# Patient Record
Sex: Female | Born: 1975 | Race: White | Hispanic: No | Marital: Married | Smoking: Never smoker
Health system: Southern US, Community
[De-identification: ages and names within clinical notes are randomized; demographics above are authoritative.]

## PROBLEM LIST (undated history)

## (undated) DIAGNOSIS — Z808 Family history of malignant neoplasm of other organs or systems: Secondary | ICD-10-CM

## (undated) DIAGNOSIS — E782 Mixed hyperlipidemia: Secondary | ICD-10-CM

## (undated) DIAGNOSIS — M961 Postlaminectomy syndrome, not elsewhere classified: Secondary | ICD-10-CM

## (undated) DIAGNOSIS — M51369 Other intervertebral disc degeneration, lumbar region without mention of lumbar back pain or lower extremity pain: Secondary | ICD-10-CM

## (undated) DIAGNOSIS — N871 Moderate cervical dysplasia: Secondary | ICD-10-CM

## (undated) DIAGNOSIS — M545 Low back pain, unspecified: Secondary | ICD-10-CM

## (undated) DIAGNOSIS — Z9889 Other specified postprocedural states: Secondary | ICD-10-CM

## (undated) DIAGNOSIS — M5136 Other intervertebral disc degeneration, lumbar region: Secondary | ICD-10-CM

## (undated) DIAGNOSIS — Z803 Family history of malignant neoplasm of breast: Secondary | ICD-10-CM

## (undated) HISTORY — PX: AUGMENTATION MAMMAPLASTY: SUR837

## (undated) HISTORY — DX: Family history of malignant neoplasm of breast: Z80.3

## (undated) HISTORY — DX: Other specified postprocedural states: Z98.890

## (undated) HISTORY — DX: Family history of malignant neoplasm of other organs or systems: Z80.8

## (undated) HISTORY — DX: Other intervertebral disc degeneration, lumbar region without mention of lumbar back pain or lower extremity pain: M51.369

## (undated) HISTORY — PX: TONSILLECTOMY: SUR1361

## (undated) HISTORY — DX: Postlaminectomy syndrome, not elsewhere classified: M96.1

## (undated) HISTORY — DX: Moderate cervical dysplasia: N87.1

## (undated) HISTORY — PX: BACK SURGERY: SHX140

## (undated) HISTORY — DX: Other intervertebral disc degeneration, lumbar region: M51.36

## (undated) HISTORY — DX: Low back pain, unspecified: M54.50

## (undated) HISTORY — DX: Mixed hyperlipidemia: E78.2

---
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
## 2000-05-30 ENCOUNTER — Inpatient Hospital Stay (HOSPITAL_COMMUNITY): Admission: AD | Admit: 2000-05-30 | Discharge: 2000-05-30 | Payer: Self-pay | Admitting: Gynecology

## 2000-05-30 ENCOUNTER — Inpatient Hospital Stay (HOSPITAL_COMMUNITY): Admission: AD | Admit: 2000-05-30 | Discharge: 2000-06-01 | Payer: Self-pay | Admitting: *Deleted

## 2000-06-02 ENCOUNTER — Encounter: Admission: RE | Admit: 2000-06-02 | Discharge: 2000-07-02 | Payer: Self-pay | Admitting: *Deleted

## 2000-07-11 ENCOUNTER — Other Ambulatory Visit: Admission: RE | Admit: 2000-07-11 | Discharge: 2000-07-11 | Payer: Self-pay | Admitting: *Deleted

## 2001-03-26 ENCOUNTER — Other Ambulatory Visit: Admission: RE | Admit: 2001-03-26 | Discharge: 2001-03-26 | Payer: Self-pay | Admitting: *Deleted

## 2001-08-15 ENCOUNTER — Inpatient Hospital Stay (HOSPITAL_COMMUNITY): Admission: AD | Admit: 2001-08-15 | Discharge: 2001-08-18 | Payer: Self-pay | Admitting: Gynecology

## 2001-09-27 ENCOUNTER — Other Ambulatory Visit: Admission: RE | Admit: 2001-09-27 | Discharge: 2001-09-27 | Payer: Self-pay | Admitting: *Deleted

## 2002-11-07 ENCOUNTER — Other Ambulatory Visit: Admission: RE | Admit: 2002-11-07 | Discharge: 2002-11-07 | Payer: Self-pay | Admitting: Gynecology

## 2005-01-19 ENCOUNTER — Other Ambulatory Visit: Admission: RE | Admit: 2005-01-19 | Discharge: 2005-01-19 | Payer: Self-pay | Admitting: Gynecology

## 2005-08-18 ENCOUNTER — Other Ambulatory Visit: Admission: RE | Admit: 2005-08-18 | Discharge: 2005-08-18 | Payer: Self-pay | Admitting: Gynecology

## 2006-03-08 ENCOUNTER — Other Ambulatory Visit: Admission: RE | Admit: 2006-03-08 | Discharge: 2006-03-08 | Payer: Self-pay | Admitting: Gynecology

## 2006-04-04 ENCOUNTER — Encounter: Admission: RE | Admit: 2006-04-04 | Discharge: 2006-04-04 | Payer: Self-pay | Admitting: Gynecology

## 2007-01-11 ENCOUNTER — Encounter: Admission: RE | Admit: 2007-01-11 | Discharge: 2007-01-11 | Payer: Self-pay | Admitting: Gynecology

## 2007-01-17 ENCOUNTER — Encounter: Admission: RE | Admit: 2007-01-17 | Discharge: 2007-01-17 | Payer: Self-pay | Admitting: Gynecology

## 2007-04-17 ENCOUNTER — Encounter: Admission: RE | Admit: 2007-04-17 | Discharge: 2007-04-17 | Payer: Self-pay | Admitting: Gynecology

## 2007-04-24 ENCOUNTER — Encounter: Admission: RE | Admit: 2007-04-24 | Discharge: 2007-04-24 | Payer: Self-pay | Admitting: Gynecology

## 2007-04-24 ENCOUNTER — Encounter (INDEPENDENT_AMBULATORY_CARE_PROVIDER_SITE_OTHER): Payer: Self-pay | Admitting: Diagnostic Radiology

## 2008-05-08 ENCOUNTER — Encounter: Admission: RE | Admit: 2008-05-08 | Discharge: 2008-05-08 | Payer: Self-pay | Admitting: Obstetrics & Gynecology

## 2008-05-29 ENCOUNTER — Ambulatory Visit: Payer: Self-pay | Admitting: Gynecology

## 2008-05-29 ENCOUNTER — Encounter: Payer: Self-pay | Admitting: Gynecology

## 2008-09-07 ENCOUNTER — Ambulatory Visit: Payer: Self-pay | Admitting: Diagnostic Radiology

## 2008-09-07 ENCOUNTER — Emergency Department (HOSPITAL_BASED_OUTPATIENT_CLINIC_OR_DEPARTMENT_OTHER): Admission: EM | Admit: 2008-09-07 | Discharge: 2008-09-07 | Payer: Self-pay | Admitting: Emergency Medicine

## 2009-01-15 IMAGING — US US ABDOMEN COMPLETE
1 series · 14 of 25 positions shown · non-contrast
Comparison: none

CLINICAL DATA: Abdominal pain, nausea.
ABDOMEN ULTRASOUND:
TECHNIQUE: Complete abdominal ultrasound examination was performed including evaluation of the liver, gallbladder, bile ducts, pancreas, kidneys, spleen, IVC, and abdominal aorta.

[Series 1: us abdomen complete · 0.24mm/px · 14 of 79 slices shown]
[im 1/79]
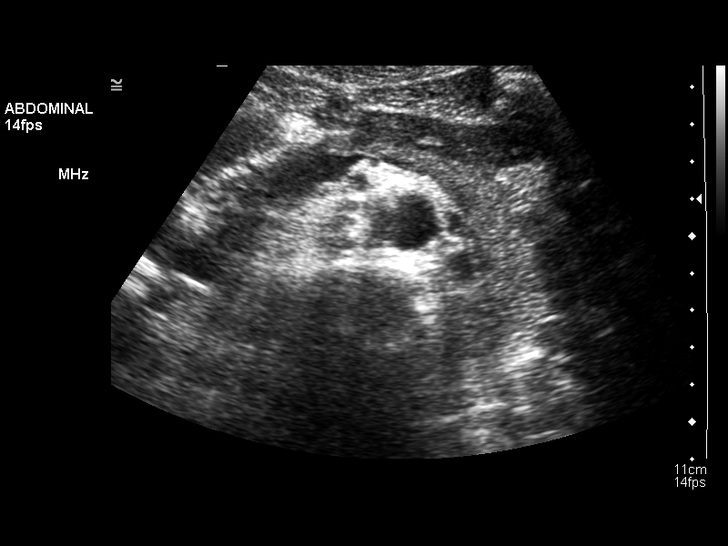
[im 7/79]
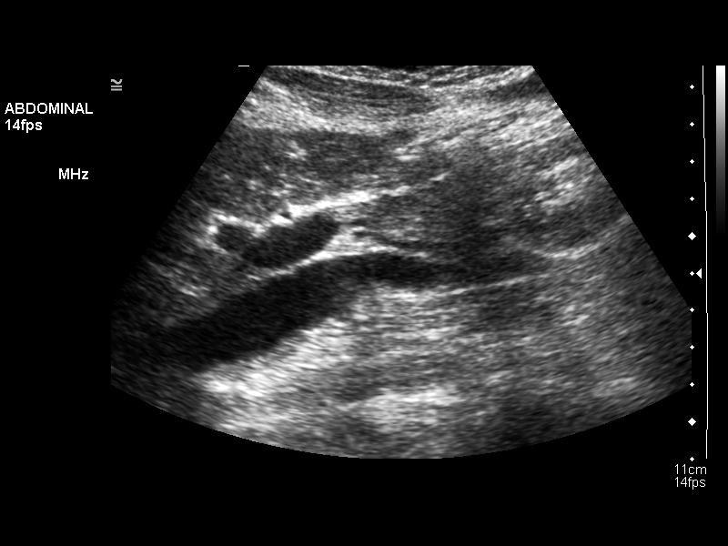
[im 14/79]
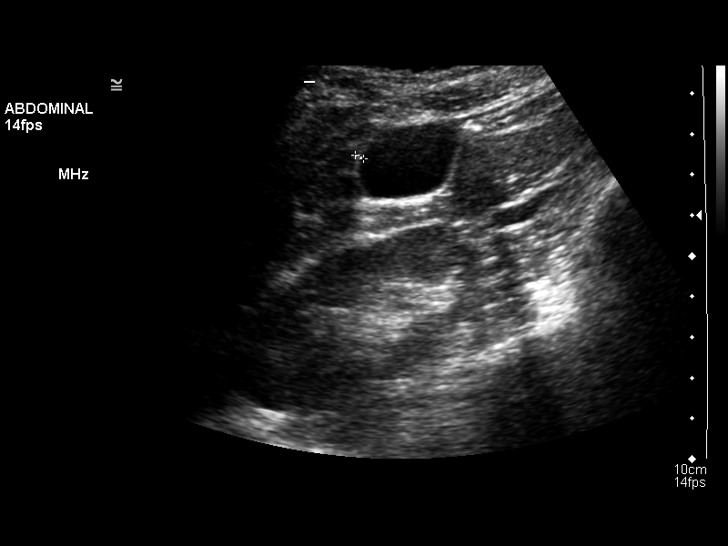
[im 20/79]
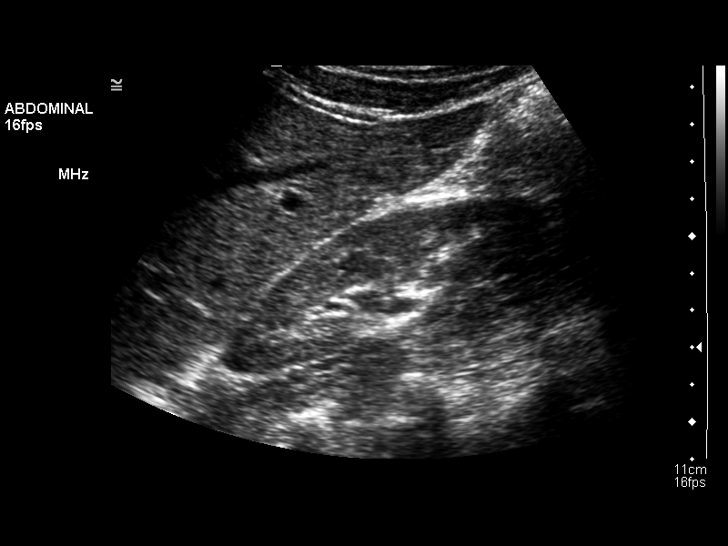
[im 27/79]
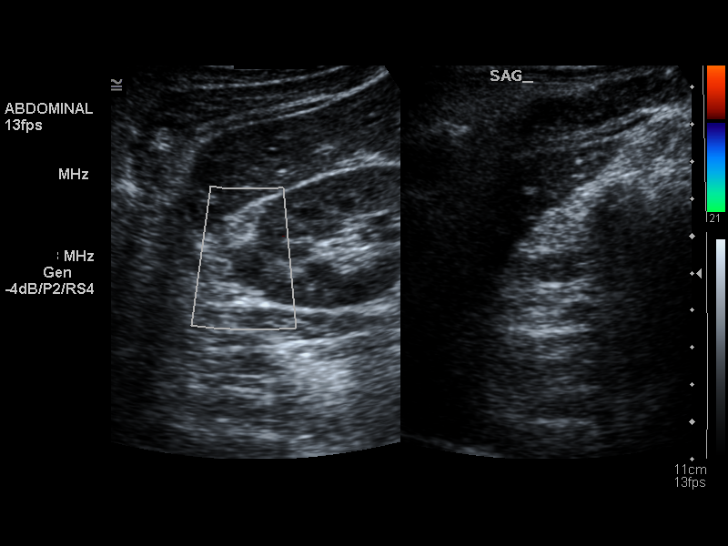
[im 30/79]
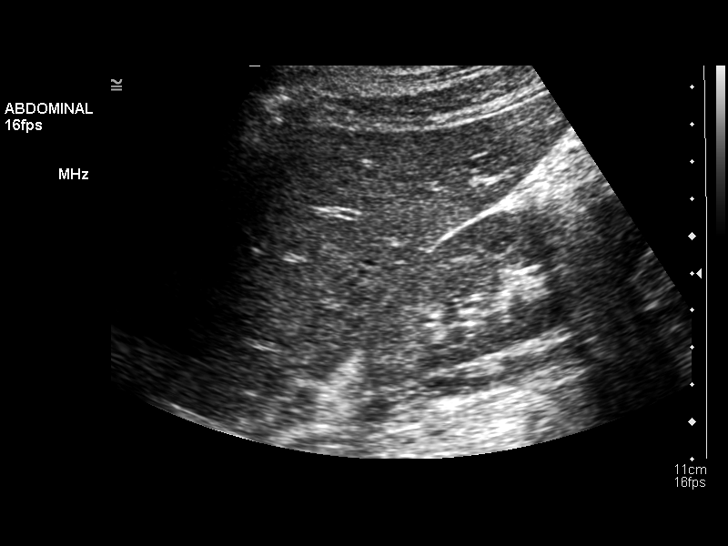
[im 36/79]
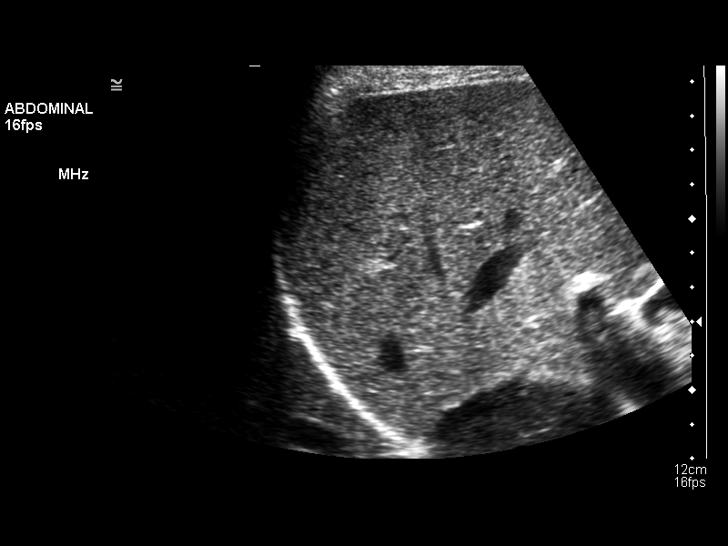
[im 43/79]
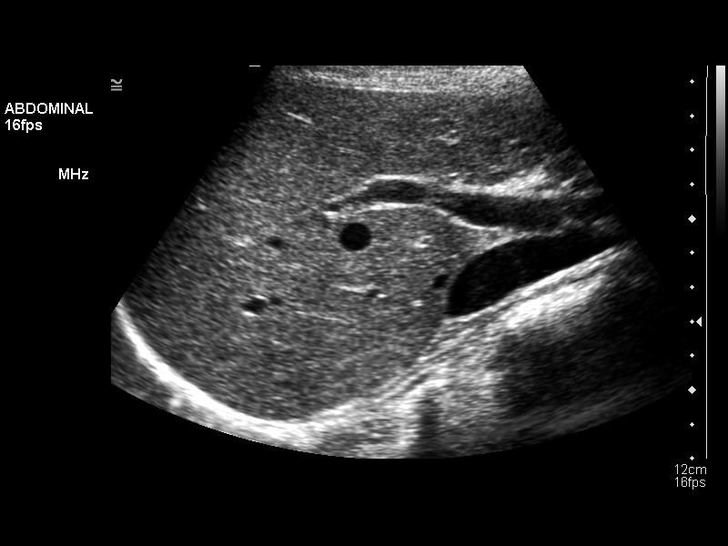
[im 49/79]
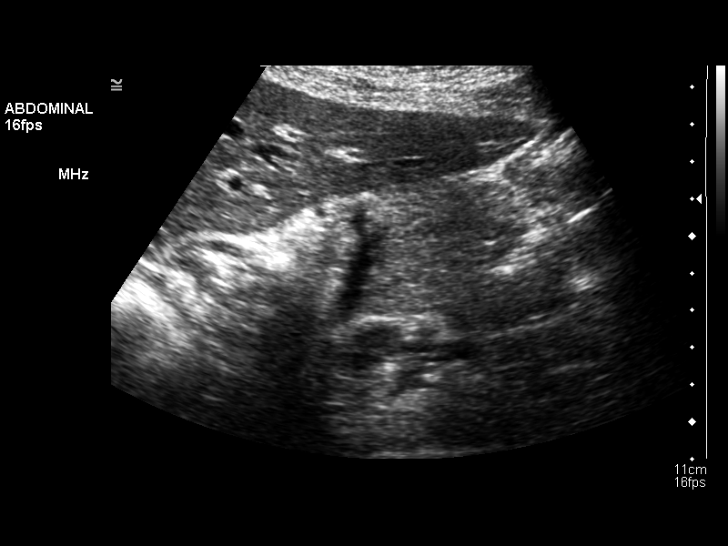
[im 53/79]
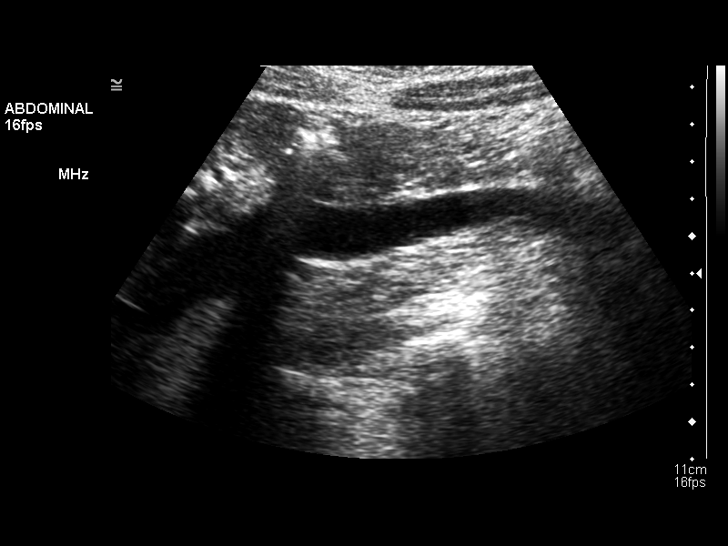
[im 59/79]
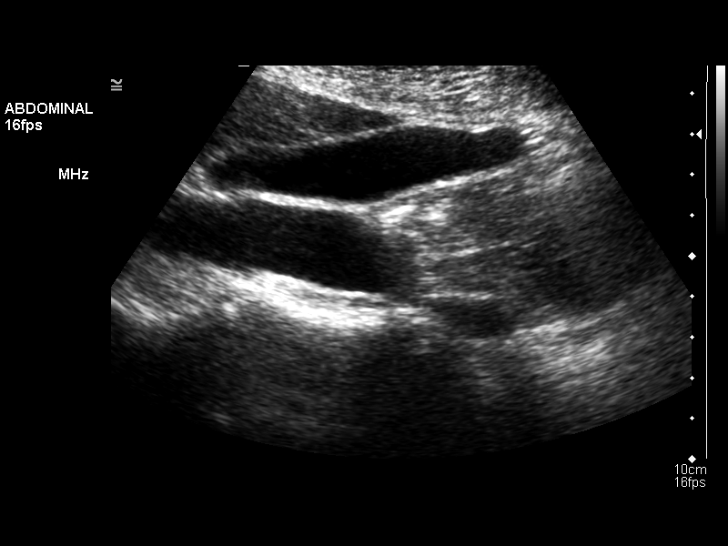
[im 66/79]
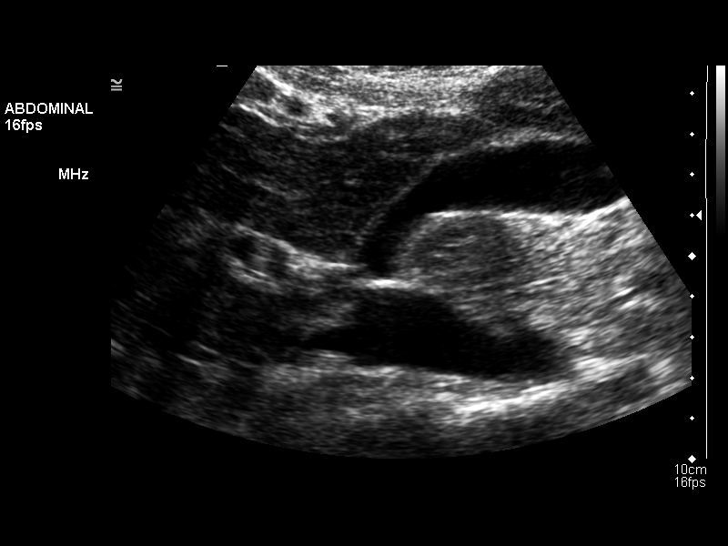
[im 72/79]
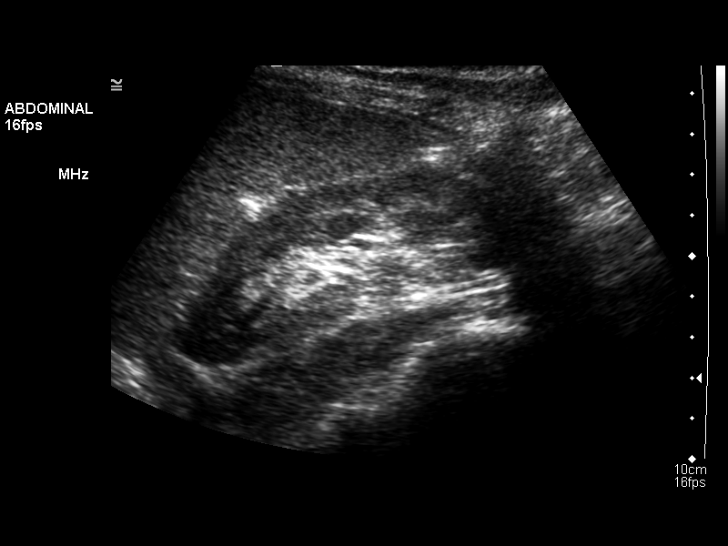
[im 79/79]
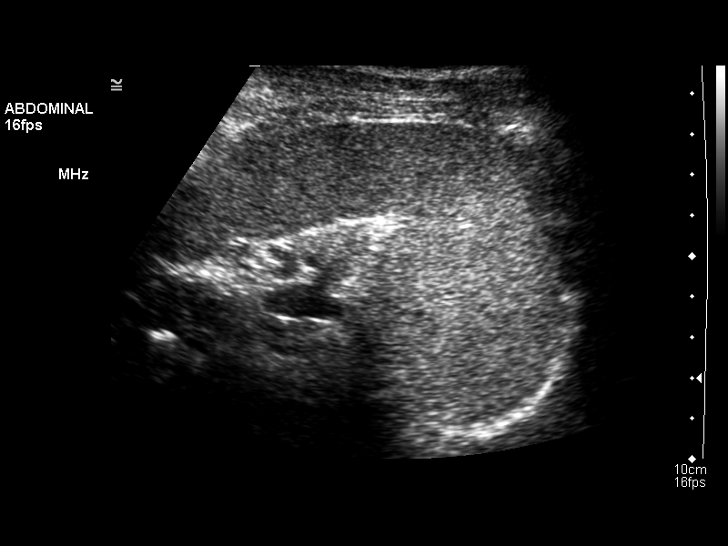

[14 of 25 positions shown; findings below may reference images not displayed]

FINDINGS: Normal gallbladder. Gallbladder wall thickness is 2.2 mm.  Common duct measures 3.0 mm in diameter, which is well within the normal limits.  No pancreatic or splenic abnormality.  Spleen measures 9.5 cm in length.  Right and left kidneys measure 10.4 cm and 11.3 cm in length, respectively.  
There is a 0.9 x 1.2 x 1.3 cm focal projection off the lateral aspect of the mid right kidney, which is suspicious for a small mass.  Early tumor cannot be excluded.  
Patent IVC.  Abdominal aorta maximum diameter is 2.0 cm.
IMPRESSION: Normal gallbladder.  No biliary ductal dilatation.  Small masslike projection off the lateral aspect of the right kidney, suspicious for a tumor or complicated cyst.Early tumor cannot be excluded at this point.  a CT scan with and without IV contrast for further imaging assessment or, if CT is not chosen, then I would at least obtain a follow-up ultrasound exam in 4 to 6 months.

## 2010-03-14 ENCOUNTER — Encounter: Payer: Self-pay | Admitting: Gynecology

## 2010-06-04 ENCOUNTER — Encounter (INDEPENDENT_AMBULATORY_CARE_PROVIDER_SITE_OTHER): Payer: BC Managed Care – PPO | Admitting: Women's Health

## 2010-06-04 ENCOUNTER — Other Ambulatory Visit: Payer: Self-pay | Admitting: Women's Health

## 2010-06-04 ENCOUNTER — Other Ambulatory Visit (HOSPITAL_COMMUNITY)
Admission: RE | Admit: 2010-06-04 | Discharge: 2010-06-04 | Disposition: A | Discharge status: Home / Self Care | Payer: BC Managed Care – PPO | Source: Ambulatory Visit | Attending: Gynecology | Admitting: Gynecology

## 2010-06-04 DIAGNOSIS — E079 Disorder of thyroid, unspecified: Secondary | ICD-10-CM

## 2010-06-04 DIAGNOSIS — Z13220 Encounter for screening for lipoid disorders: Secondary | ICD-10-CM

## 2010-06-04 DIAGNOSIS — Z01419 Encounter for gynecological examination (general) (routine) without abnormal findings: Secondary | ICD-10-CM

## 2010-06-04 DIAGNOSIS — Z833 Family history of diabetes mellitus: Secondary | ICD-10-CM

## 2010-06-04 DIAGNOSIS — Z124 Encounter for screening for malignant neoplasm of cervix: Secondary | ICD-10-CM | POA: Insufficient documentation

## 2010-07-09 NOTE — H&P (Signed)
[facility]  Patient:    [name], [name] Visit Number: [id number] MRN: [id number]          Service Type: OBS Location: 910B 9158 01 Attending Physician:  Tonye Royalty Dictated by:   Katy Fitch, M.D. Admit Date:  08/15/2001                           History and Physical  CHIEF COMPLAINT:              Term pregnancy.  HISTORY OF PRESENT ILLNESS:   The patient is a 35 year old G2, P1, at 39-4/7 weeks, brought in the hospital for induction secondary to a favorable cervix. The patient history had no prenatal complications and has had routine checkups.  PAST MEDICAL HISTORY:         None.  PAST SURGICAL HISTORY:        None.  MEDICATIONS:                  Prenatal vitamins.  ALLERGIES:                    None.  SOCIAL HISTORY:               Denied any tobacco, alcohol, or drugs.  FAMILY HISTORY:               Without any epithelial cancers or mental retardation.  PHYSICAL EXAMINATION:  VITAL SIGNS:                  Blood pressure 112/70.  HEENT:                        Throat clear.  CHEST:                        Lungs clear to auscultation bilaterally.  CARDIAC:                      Regular rate and rhythm.  ABDOMEN:                      Gravid, nontender.  Estimated fetal weight 7 pounds 8 ounces. PELVIC:                       Cervix 2, 80%, and -2.  ASSESSMENT AND PLAN:          A 35 year old G2, P2, at 39 weeks, favorable cervix.  Will induce with Pitocin.  The patient is GBS-negative, will not need antibiotic prophylaxis. Dictated by:   Katy Fitch, M.D. Attending Physician:  Tonye Royalty DD:  08/15/01 TD:  08/15/01 Job: [id number]

## 2010-07-09 NOTE — Discharge Summary (Signed)
[facility]  Patient:    [name], [name]                          MRN: [id number] Adm. Date:  [id number] Disc. Date: [id number] Attending:  Wetzel Bjornstad Dictator:   Antony Contras, N.P.                           Discharge Summary  DISCHARGE DIAGNOSES:          Intrauterine pregnancy at 38 weeks, spontaneous onset of labor.  PROCEDURE:                    Normal spontaneous vaginal delivery of viable infant over second degree midline episiotomy.  HISTORY OF PRESENT ILLNESS:   The patient is a 35 year old prima gravida with an LMP of September 02, 1999, [facility] June 08, 2000.  Prenatal course was complicated by history of migraine headaches and also a late entry into prenatal care.  PRENATAL LABORATORIES:        Blood type O+.  Antibody screen negative.  RPR, HBSAG, HIV nonreactive.  Rubella immune.  MSAFP normal.  HOSPITAL COURSE:              Patient presented on May 30, 2000 with regular contractions and dilatation of 4 cm, 90%, and 0 station.  She progressed to complete dilatation; delivered an Apgar 9/9 7 pound 11 ounce female infant over a second degree midline episiotomy.  Postpartum course:  She remained afebrile, had no difficulty voiding, was able to be discharged on her second postpartum day in satisfactory condition.  LABORATORIES:                 CBC:  Hematocrit 31.8, hemoglobin 11.5, WBC 14.2, platelets 209,000.  DISPOSITION:                  Follow-up six weeks.  Continue prenatal vitamins and iron.  Motrin and Tylox for pain. DD:  06/26/00 TD:  06/26/00 Job: [id number]

## 2010-07-09 NOTE — Discharge Summary (Signed)
[facility]  Patient:    [name], [name] Visit Number: [id number] MRN: [id number]          Service Type: OBS Location: 910A 9102 01 Attending Physician:  Tonye Royalty Dictated by:   Antony Contras, [facility] Admit Date:  08/15/2001 Discharge Date: 08/18/2001                             Discharge Summary  DISCHARGE DIAGNOSIS:          Intrauterine pregnancy at 39 weeks.  PROCEDURES:                   Normal spontaneous vaginal delivery of a viable infant over a second degree midline episiotomy.  HISTORY OF PRESENT ILLNESS:   The patient is a 35 year old, gravida 2, para 1-0-0-1, LMP November 13, 2000, [facility] August 20, 2001.  The prenatal course was uncomplicated.  LABORATORY DATA:              Blood type O+.  Antibody screen negative. Sickle cell negative.  RPR, HBSAG, and HIV nonreactive.  CBC with hematocrit 36.8, hemoglobin 12.4, WBC 11.8, and platelets 252.  HOSPITAL COURSE:              The patient was admitted on August 16, 2001, with spontaneous onset of labor.  Delivery was accomplished spontaneously. McRoberts procedure was attempted to accomplish delivery secondary to moderate shoulder dystocia.  The patient delivered an Apgar 5 and 7 female infant weighing 8 pounds 15 ounces.  During the postpartum course, the patient remained afebrile with no difficulty voiding.  DISPOSITION:                  She was able to be discharged on the second postpartum day in satisfactory condition.  FOLLOW-UP:                    Follow up in six weeks.  DISCHARGE MEDICATIONS:        Continue prenatal vitamins and iron.  Motrin or Tylox for pain. Dictated by:   Antony Contras, [facility] Attending Physician:  Tonye Royalty DD:  09/06/01 TD:  09/13/01 Job: [id number]

## 2011-12-06 ENCOUNTER — Other Ambulatory Visit: Payer: Self-pay | Admitting: Gynecology

## 2011-12-06 DIAGNOSIS — Z1231 Encounter for screening mammogram for malignant neoplasm of breast: Secondary | ICD-10-CM

## 2011-12-09 ENCOUNTER — Encounter: Payer: BC Managed Care – PPO | Admitting: Women's Health

## 2011-12-20 ENCOUNTER — Ambulatory Visit
Admission: RE | Admit: 2011-12-20 | Discharge: 2011-12-20 | Disposition: A | Discharge status: Home / Self Care | Payer: BC Managed Care – PPO | Source: Ambulatory Visit | Attending: Gynecology | Admitting: Gynecology

## 2011-12-20 DIAGNOSIS — Z1231 Encounter for screening mammogram for malignant neoplasm of breast: Secondary | ICD-10-CM

## 2013-05-10 ENCOUNTER — Ambulatory Visit: Payer: Self-pay

## 2013-05-10 ENCOUNTER — Other Ambulatory Visit: Payer: Self-pay | Admitting: Occupational Medicine

## 2013-05-10 DIAGNOSIS — M25562 Pain in left knee: Secondary | ICD-10-CM

## 2013-06-04 ENCOUNTER — Ambulatory Visit (INDEPENDENT_AMBULATORY_CARE_PROVIDER_SITE_OTHER): Payer: BC Managed Care – PPO | Admitting: Family Medicine

## 2013-06-04 VITALS — BP 130/84 | HR 88 | Temp 98.1°F | Resp 18 | Ht 68.0 in | Wt 167.8 lb

## 2013-06-04 DIAGNOSIS — M25569 Pain in unspecified knee: Secondary | ICD-10-CM

## 2013-06-04 DIAGNOSIS — M25562 Pain in left knee: Secondary | ICD-10-CM

## 2013-06-04 NOTE — Progress Notes (Signed)
I have discussed this case with Ms. Gessner, NP and agree.  

## 2013-06-04 NOTE — Progress Notes (Signed)
   Subjective:    Patient ID: [name], female    DOB: [birth year], 38 y.o.   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 She was seen [facility]. An Xray was obtained. Patient was denied WC and was put on light duty. She states she was denied WC because there was no accident. She presents today to have a note to return to full duty. She was advised to have an MRI and it was denied.  She reports that her knee pain has resolved completely.   Left knee xray 05/10/13: FINDINGS:  No acute bony abnormality. Specifically, no fracture, subluxation,  or dislocation. Soft tissues are intact. Joint spaces are  maintained. Normal bone mineralization. No joint effusion  IMPRESSION:  Negative.  PMH- none PSH- none  Patient sees Gyn regularly, no other PCP.   Review of Systems No knee pain, no swelling, no falls    Objective:   Physical Exam  Nursing note and vitals reviewed. Constitutional: She is oriented to person, place, and time. She appears well-developed and well-nourished.  HENT:  Head: Normocephalic and atraumatic.  Neck: Normal range of motion. Neck supple.  Pulmonary/Chest: Effort normal.  Musculoskeletal: Normal range of motion. She exhibits no edema and no tenderness.  Neurological: She is alert and oriented to person, place, and time.  Skin: Skin is warm and dry.  Psychiatric: She has a normal mood and affect. Her behavior is normal. Judgment and thought content normal.      Assessment & Plan:  1. Knee pain, left Resolved. Patient given note to return to full duties. Follow up if symptoms return.   Emi Belfasteborah B. Gessner, FNP-BC  [facility]  06/04/2013 9:50 AM

## 2013-06-10 ENCOUNTER — Other Ambulatory Visit: Payer: Self-pay | Admitting: Occupational Medicine

## 2013-06-10 ENCOUNTER — Ambulatory Visit: Payer: Self-pay

## 2013-06-10 DIAGNOSIS — M549 Dorsalgia, unspecified: Secondary | ICD-10-CM

## 2013-10-01 ENCOUNTER — Other Ambulatory Visit: Payer: Self-pay | Admitting: Women's Health

## 2013-10-01 ENCOUNTER — Telehealth: Payer: Self-pay | Admitting: *Deleted

## 2013-10-01 ENCOUNTER — Encounter: Payer: Self-pay | Admitting: Women's Health

## 2013-10-01 ENCOUNTER — Other Ambulatory Visit (HOSPITAL_COMMUNITY)
Admission: RE | Admit: 2013-10-01 | Discharge: 2013-10-01 | Disposition: A | Discharge status: Home / Self Care | Payer: BC Managed Care – PPO | Source: Ambulatory Visit | Attending: Women's Health | Admitting: Women's Health

## 2013-10-01 ENCOUNTER — Ambulatory Visit (INDEPENDENT_AMBULATORY_CARE_PROVIDER_SITE_OTHER): Payer: BC Managed Care – PPO | Admitting: Women's Health

## 2013-10-01 VITALS — BP 120/80 | Ht 67.0 in | Wt 183.6 lb

## 2013-10-01 DIAGNOSIS — Z1151 Encounter for screening for human papillomavirus (HPV): Secondary | ICD-10-CM | POA: Diagnosis present

## 2013-10-01 DIAGNOSIS — R87810 Cervical high risk human papillomavirus (HPV) DNA test positive: Secondary | ICD-10-CM | POA: Diagnosis present

## 2013-10-01 DIAGNOSIS — Z833 Family history of diabetes mellitus: Secondary | ICD-10-CM

## 2013-10-01 DIAGNOSIS — G4700 Insomnia, unspecified: Secondary | ICD-10-CM

## 2013-10-01 DIAGNOSIS — Z13220 Encounter for screening for lipoid disorders: Secondary | ICD-10-CM

## 2013-10-01 DIAGNOSIS — Z01419 Encounter for gynecological examination (general) (routine) without abnormal findings: Secondary | ICD-10-CM

## 2013-10-01 DIAGNOSIS — Z124 Encounter for screening for malignant neoplasm of cervix: Secondary | ICD-10-CM | POA: Diagnosis not present

## 2013-10-01 LAB — LIPID PANEL
CHOL/HDL RATIO: 2.7 ratio
Cholesterol: 210 mg/dL — ABNORMAL HIGH (ref 0–200)
HDL: 77 mg/dL (ref >39)
LDL Cholesterol: 94 mg/dL (ref 0–99)
TRIGLYCERIDES: 193 mg/dL — AB (ref <150)
VLDL: 39 mg/dL (ref 0–40)

## 2013-10-01 LAB — CBC WITH DIFFERENTIAL/PLATELET
BASOS PCT: 1 % (ref 0–1)
Basophils Absolute: 0 10*3/uL (ref 0.0–0.1)
Eosinophils Absolute: 0.1 10*3/uL (ref 0.0–0.7)
Eosinophils Relative: 3 % (ref 0–5)
HCT: 42 % (ref 36.0–46.0)
HEMOGLOBIN: 14.7 g/dL (ref 12.0–15.0)
LYMPHS ABS: 0.8 10*3/uL (ref 0.7–4.0)
Lymphocytes Relative: 20 % (ref 12–46)
MCH: 33.2 pg (ref 26.0–34.0)
MCHC: 35 g/dL (ref 30.0–36.0)
MCV: 94.8 fL (ref 78.0–100.0)
Monocytes Absolute: 0.5 10*3/uL (ref 0.1–1.0)
Monocytes Relative: 11 % (ref 3–12)
NEUTROS ABS: 2.7 10*3/uL (ref 1.7–7.7)
NEUTROS PCT: 65 % (ref 43–77)
PLATELETS: 248 10*3/uL (ref 150–400)
RBC: 4.43 MIL/uL (ref 3.87–5.11)
RDW: 12.5 % (ref 11.5–15.5)
WBC: 4.2 10*3/uL (ref 4.0–10.5)

## 2013-10-01 LAB — GLUCOSE, RANDOM: GLUCOSE: 101 mg/dL — AB (ref 70–99)

## 2013-10-01 MED ORDER — ALPRAZOLAM 0.25 MG PO TABS: 0.2500 mg | ORAL_TABLET | Freq: Every evening | ORAL | Status: DC | PRN

## 2013-10-01 NOTE — Telephone Encounter (Signed)
Telephone call, reviewed need to call counselor for situational stress/grief. Impending divorce. Xanax 0.25 at bedtime when necessary reviewed addictive, not to take daily, sleep hygiene reviewed.

## 2013-10-01 NOTE — Telephone Encounter (Signed)
Pt was seen today and was under impression Rx was going to sent to pharmacy for possible depression? Please advise

## 2013-10-01 NOTE — Patient Instructions (Signed)

## 2013-10-01 NOTE — Progress Notes (Signed)
[name] Jul 31, 1975 [id number]    History:    Presents for annual exam.  Regular monthly cycle/not sexually active. Separated from has been, no infidelity. Mother died from metastatic cancer 2012. Questions depression. Reports one abnormal Pap in the past but normal for the last 10 years. Last annual exam 2012.  Past medical history, past surgical history, family history and social history were all reviewed and documented in the EPIC chart. Works for [employer], [name] 11, /cidy 12 both doing okay with impending divorce.  ROS:  A  12 point ROS was performed and pertinent positives and negatives are included.  Exam:  Filed Vitals:   10/01/13 1212  BP: 120/80    General appearance:  Normal Thyroid:  Symmetrical, normal in size, without palpable masses or nodularity. Respiratory  Auscultation:  Clear without wheezing or rhonchi Cardiovascular  Auscultation:  Regular rate, without rubs, murmurs or gallops  Edema/varicosities:  Not grossly evident Abdominal  Soft,nontender, without masses, guarding or rebound.  Liver/spleen:  No organomegaly noted  Hernia:  None appreciated  Skin  Inspection:  Grossly normal   Breasts: Examined lying and sitting/bilateral saline implants.     Right: Without masses, retractions, discharge or axillary adenopathy.     Left: Without masses, retractions, discharge or axillary adenopathy. Gentitourinary   Inguinal/mons:  Normal without inguinal adenopathy  External genitalia:  Normal  BUS/Urethra/Skene's glands:  Normal  Vagina:  Normal  Cervix:  Normal  Uterus:  normal in size, shape and contour.  Midline and mobile  Adnexa/parametria:     Rt: Without masses or tenderness.   Lt: Without masses or tenderness.  Anus and perineum: Normal  Digital rectal exam: Normal sphincter tone without palpated masses or tenderness  Assessment/Plan:  38 y.o. Separated WF G2P2  for annual exam.    Situational grief Normal GYN exam  Plan: Reviewed/encouraged  counseling for impending divorce and loss of mother. (Tearful most of exam.)  SBE's, annual mammogram, instructed to schedule. Increase regular exercise, increase pleasurable activities. CBC, glucose, lipid panel, UA, Pap with HR HPV typing. New screening guidelines reviewed. Contraception reviewed and declined. States will return to office if needed.  Note: This dictation was prepared with Dragon/digital dictation.  Any transcriptional errors that result are unintentional. Harrington ChallengerYOUNG,Vianney Kopecky J WHNP, 1:25 PM 10/01/2013

## 2013-10-02 LAB — URINALYSIS W MICROSCOPIC + REFLEX CULTURE
BACTERIA UA: NONE SEEN
BILIRUBIN URINE: NEGATIVE
CASTS: NONE SEEN
CRYSTALS: NONE SEEN
Glucose, UA: NEGATIVE mg/dL
Hgb urine dipstick: NEGATIVE
Ketones, ur: NEGATIVE mg/dL
Leukocytes, UA: NEGATIVE
NITRITE: NEGATIVE
PROTEIN: NEGATIVE mg/dL
Specific Gravity, Urine: 1.019 (ref 1.005–1.030)
Squamous Epithelial / LPF: NONE SEEN
UROBILINOGEN UA: 0.2 mg/dL (ref 0.0–1.0)
pH: 7 (ref 5.0–8.0)

## 2013-10-03 LAB — CYTOLOGY - PAP

## 2013-10-22 ENCOUNTER — Ambulatory Visit (INDEPENDENT_AMBULATORY_CARE_PROVIDER_SITE_OTHER): Payer: BC Managed Care – PPO | Admitting: Gynecology

## 2013-10-22 ENCOUNTER — Encounter: Payer: Self-pay | Admitting: Gynecology

## 2013-10-22 VITALS — BP 126/70

## 2013-10-22 DIAGNOSIS — IMO0002 Reserved for concepts with insufficient information to code with codable children: Secondary | ICD-10-CM

## 2013-10-22 DIAGNOSIS — R6889 Other general symptoms and signs: Secondary | ICD-10-CM

## 2013-10-22 NOTE — Progress Notes (Signed)
   The patient is a 38 year old who presented to the office for colposcopy as a result of her recent Pap smear which demonstrated the following:  ATYPICAL SQUAMOUS CELLS OF UNDETERMINED SIGNIFICANCE (ASC-US).  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 Patient currently not sexually active. Patient reports normal menstrual cycles.  Patient underwent a detail colposcopic evaluation of the external genitalia, perineum, perirectal area as well as the vagina. After applying acetic gas into the vagina and cervix the following was noted:  Physical Exam  Genitourinary:     Assessment/plan: Recent past with atypical squamous cells of undetermined significance and high risk HPV identified but no hybrid capture. Acetowhite areas noted at the 12 and 6:00 position of the ectocervix were respectively biopsy and ECC. We will notify the patient with the results when available. Literature information was provided.

## 2013-10-22 NOTE — Patient Instructions (Signed)
Colposcopy Colposcopy is a procedure to examine your cervix and vagina, or the area around the outside of your vagina, for abnormalities or signs of disease. The procedure is done using a lighted microscope called a colposcope. Tissue samples may be collected during the colposcopy if your health care provider finds any unusual cells. A colposcopy may be done if a woman has:  An abnormal Pap test. A Pap test is a medical test done to evaluate cells that are on the surface of the cervix.  A Pap test result that is suggestive of human papillomavirus (HPV). This virus can cause genital warts and is linked to the development of cervical cancer.  A sore on her cervix and the results of a Pap test were normal.  Genital warts on the cervix or in or around the outside of the vagina.  A mother who took the drug diethylstilbestrol (DES) while pregnant.  Painful intercourse.  Vaginal bleeding, especially after sexual intercourse. LET YOUR HEALTH CARE PROVIDER KNOW ABOUT:  Any allergies you have.  All medicines you are taking, including vitamins, herbs, eye drops, creams, and over-the-counter medicines.  Previous problems you or members of your family have had with the use of anesthetics.  Any blood disorders you have.  Previous surgeries you have had.  Medical conditions you have. RISKS AND COMPLICATIONS Generally, a colposcopy is a safe procedure. However, as with any procedure, complications can occur. Possible complications include:  Bleeding.  Infection.  Missed lesions. BEFORE THE PROCEDURE   Tell your health care provider if you have your menstrual period. A colposcopy typically is not done during menstruation.  For 24 hours before the colposcopy, do not:  Douche.  Use tampons.  Use medicines, creams, or suppositories in the vagina.  Have sexual intercourse. PROCEDURE  During the procedure, you will be lying on your back with your feet in foot rests (stirrups). A warm  metal or plastic instrument (speculum) will be placed in your vagina to keep it open and to allow the health care provider to see the cervix. The colposcope will be placed outside the vagina. It will be used to magnify and examine the cervix, vagina, and the area around the outside of the vagina. A small amount of liquid solution will be placed on the area that is to be viewed. This solution will make it easier to see the abnormal cells. Your health care provider will use tools to suck out mucus and cells from the canal of the cervix. Then he or she will record the location of the abnormal areas. If a biopsy is done during the procedure, a medicine will usually be given to numb the area (local anesthetic). You may feel mild pain or cramping while the biopsy is done. After the procedure, tissue samples collected during the biopsy will be sent to a lab for analysis. AFTER THE PROCEDURE  You will be given instructions on when to follow up with your health care provider for your test results. It is important to keep your appointment. Document Released: 04/30/2002 Document Revised: 10/10/2012 Document Reviewed: 09/06/2012 ExitCare Patient Information 2015 [employer]. This information is not intended to replace advice given to you by your health care provider. Make sure you discuss any questions you have with your health care provider.  

## 2013-10-23 ENCOUNTER — Telehealth: Payer: Self-pay

## 2013-10-23 NOTE — Telephone Encounter (Signed)
Yes, please call and Valium 5 mg daily when necessary, #30 with 1 refill.  review with patient addictive,  use sparingly.

## 2013-10-23 NOTE — Telephone Encounter (Signed)
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 Glenetta Hew.  She said when she was in to see you you told her you would give her an Rx for Valium as she is going through a lot right now.  She said she checked at pharmacy and no Rx was there. Rx?

## 2013-10-24 MED ORDER — DIAZEPAM 5 MG PO TABS: 5.0000 mg | ORAL_TABLET | Freq: Four times a day (QID) | ORAL | Status: DC | PRN

## 2013-10-24 NOTE — Telephone Encounter (Signed)
Left detailed message on cell phone voice mail letting her know I called her Rx in and that [place] wanted me to warn her that this is very addictive and to use sparingly only when she feels like she really needs to use it.

## 2013-11-14 ENCOUNTER — Other Ambulatory Visit: Payer: Self-pay | Admitting: Gynecology

## 2013-11-14 ENCOUNTER — Ambulatory Visit (INDEPENDENT_AMBULATORY_CARE_PROVIDER_SITE_OTHER): Payer: BC Managed Care – PPO | Admitting: Gynecology

## 2013-11-14 ENCOUNTER — Encounter: Payer: Self-pay | Admitting: Gynecology

## 2013-11-14 DIAGNOSIS — N871 Moderate cervical dysplasia: Secondary | ICD-10-CM

## 2013-11-14 DIAGNOSIS — Z23 Encounter for immunization: Secondary | ICD-10-CM

## 2013-11-14 MED ORDER — CLINDAMYCIN PHOSPHATE 2 % VA CREA: 1.0000 | TOPICAL_CREAM | Freq: Every day | VAGINAL | Status: DC

## 2013-11-14 NOTE — Progress Notes (Signed)
   The patient presented to the office today for LEEP cervical conization. History as follows:  Pap smear August 15: ATYPICAL SQUAMOUS CELLS OF UNDETERMINED SIGNIFICANCE (ASC-US).  HPV High Risk  **DETECTED**  10/22/2013 patient went detail colposcopic evaluation with biopsy with the following pathology report:  Diagnosis 1. Endocervix, curettage - SCANT FRAGMENTS OF BENIGN ENDOCERVICAL MUCOSA, SEE COMMENT 2. Cervix, biopsy, 6 o'clock - LOW GRADE SQUAMOUS INTRAEPITHELIAL LESION, CIN-I (MILD DYSPLASIA), SEE COMMENT. 3. 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  A coated speculum was inserted into the vagina and colposcopic examination was performed with 4% acidic acid with findings noted above.  The cervix was then painted with Lugol's solution to delineate the margins of the lesion and transformation zone.  Approximately 5 cc's of  1 % xylocaine with epinephrine was infiltrated deep near the outer margin of the transformation zone circumferentially at 12, 3, 6, and 9 o'clock positions.  The [facility] Electrosurgical Generator was then turned on after the patient was grounded with pad electrode on her thigh and jewelry removed.  The settings on the generator were Blend 1 current 70 watts cut and 70 watts on the coagulation mode.  A size 20 x 12 loop electrode was utilized to exercise the atypical transformation zone.  The tip of the electrode was placed  3 mm from the edge of the lesion at 3, 6, 9 and 12 o'clock position.  The electrode was moved slowly over the lesion was within the loop limits.  A vaginal wall retractor was not used.  The loop was then repositioned and the finger switch on the hand held piece was activated ( or footpedal depressed).  A slight pressure on the shaft was applied and the loop was extended into the tissue up to its crossbar to a depth of 6 mm, then with steady, slow motion across and underneath the endocervical button was not excised.  The loop electrode was replaced with ball electrode set at 50 watts and the base of the crater was fulgurated circumferentially.  Monsell's paint was then applied for additional hemostasis.  A suture was placed at 12 o'clock position of cervical biopsy specimen for orientation, and placed in formation fixative for pathology evaluation.  Patient tolerated the procedure well with minimal blood loss and without any complications.  After the procedure patient left office with stable vital signs and instructions sheet.  Patient returned back in one month for followup. She will be prescribed Cleocin vaginal cream to apply each bedtime for 7-10 days. Patient received the flu vaccine today.   [facility] HMD12:19 PMTD@

## 2013-11-14 NOTE — Patient Instructions (Signed)
LEEP POST-PROCEDURE INSTRUCTIONS  1. You may take Ibuprofen, Aleve or Tylenol for pain if needed.  Cramping is normal.  2. You will have black and/or bloody discharge at first.  This will lighten and then turn clear before completely resolving.  This will take 2 to 3 weeks.  3. Put nothing in your vagina until the bleeding or discharge stops (usually 2 or3 days).  4. You need to call if you have redness around the biopsy site, if there is any unusual draining, if the bleeding is heavy, or if you are concerned.  5. Shower or bathe as normal  6. We will call you within one week with results or we will discuss the results at your follow-up appointment if needed.  7. You will need to return for a follow-up Pap smear as directed by your physician.  Loop Electrosurgical Excision Procedure Loop electrosurgical excision procedure (LEEP) is the removal of a portion of the lower part of the uterus (cervix). The procedure is done when there are significantly abnormal cervical cell changes. Abnormal cell changes of the cervix can lead to cancer if left in place and untreated.  The LEEP procedure itself typically only takes a few minutes. Often, it may be done in your caregiver's office. The procedure is considered safe for those who wish to get pregnant or are trying to get pregnant. Only under rare circumstances should this procedure be done if you are pregnant. LET YOUR CAREGIVER KNOW ABOUT:  Whether you are pregnant or late for your last menstrual period.  Allergies to foods or medicines.  All the medicines you are taking includingherbs, eyedrops, and over-the-counter medicines, and creams.  Use of steroids (by mouth or creams).  Previous problems with anesthetics or numbing medicine.  Previous gynecological surgery.  History of blood clots or bleeding problems.  Any recent or current vaginal infections (herpes, sexually transmitted infections).  Other health problems. RISKS AND  COMPLICATIONS  Bleeding.  Infection.  Injury to the vagina, bladder, or rectum.  Very rare obstruction of the cervical opening that causes problems during menstruation (cervical stenosis). BEFORE THE PROCEDURE  Do not take aspirin or blood thinners (anticoagulants) for 1 week before the procedure, or as told by your caregiver.  Eat a light meal before the procedure.  Ask your caregiver about changing or stopping your regular medicines.  You may be given a pain reliever 1 or 2 hours before the procedure. PROCEDURE   A tool (speculum) is placed in the vagina. This allows your caregiver to see the cervix.  An iodine stain is applied to the cervix to find the area of abnormal cells to be removed.  Medicine is injected to numb the cervix (local anesthetic).   Electricity is passed through a thin wire loop which is then used to remove (cauterize) a small segment of the affected cervix.  Light electrocautery is used to seal any small blood vessels and prevent bleeding.  A paste may be applied to the cauterized area of the cervix to help prevent bleeding.  The tissue sample is sent to the lab. It is examined under the microscope. AFTER THE PROCEDURE  Have someone drive you home.  You may have slight to moderate cramping.  You may notice a black vaginal discharge from the paste used on the cervix to prevent bleeding. This is normal.  Watch for excessive bleeding. This requires immediate medical care.  Ask when your test results will be ready. Make sure you get your test results. Document Released:  04/30/2002 Document Revised: 05/02/2011 Document Reviewed: 07/20/2010 ExitCare Patient Information 2015 [place], [place]. This information is not intended to replace advice given to you by your health care provider. Make sure you discuss any questions you have with your health care provider.  Influenza Virus Vaccine injection (Fluarix) What is this medicine? INFLUENZA VIRUS VACCINE  (in floo EN zuh VAHY ruhs vak SEEN) helps to reduce the risk of getting influenza also known as the flu. This medicine may be used for other purposes; ask your health care provider or pharmacist if you have questions. COMMON BRAND NAME(S): Fluarix, Fluzone What should I tell my health care provider before I take this medicine? They need to know if you have any of these conditions: -bleeding disorder like hemophilia -fever or infection -Guillain-Barre syndrome or other neurological problems -immune system problems -infection with the human immunodeficiency virus (HIV) or AIDS -low blood platelet counts -multiple sclerosis -an unusual or allergic reaction to influenza virus vaccine, eggs, chicken proteins, latex, gentamicin, other medicines, foods, dyes or preservatives -pregnant or trying to get pregnant -breast-feeding How should I use this medicine? This vaccine is for injection into a muscle. It is given by a health care professional. A copy of Vaccine Information Statements will be given before each vaccination. Read this sheet carefully each time. The sheet may change frequently. Talk to your pediatrician regarding the use of this medicine in children. Special care may be needed. Overdosage: If you think you have taken too much of this medicine contact a poison control center or emergency room at once. NOTE: This medicine is only for you. Do not share this medicine with others. What if I miss a dose? This does not apply. What may interact with this medicine? -chemotherapy or radiation therapy -medicines that lower your immune system like etanercept, anakinra, infliximab, and adalimumab -medicines that treat or prevent blood clots like warfarin -phenytoin -steroid medicines like prednisone or cortisone -theophylline -vaccines This list may not describe all possible interactions. Give your health care provider a list of all the medicines, herbs, non-prescription drugs, or dietary  supplements you use. Also tell them if you smoke, drink alcohol, or use illegal drugs. Some items may interact with your medicine. What should I watch for while using this medicine? Report any side effects that do not go away within 3 days to your doctor or health care professional. Call your health care provider if any unusual symptoms occur within 6 weeks of receiving this vaccine. You may still catch the flu, but the illness is not usually as bad. You cannot get the flu from the vaccine. The vaccine will not protect against colds or other illnesses that may cause fever. The vaccine is needed every year. What side effects may I notice from receiving this medicine? Side effects that you should report to your doctor or health care professional as soon as possible: -allergic reactions like skin rash, itching or hives, swelling of the face, lips, or tongue Side effects that usually do not require medical attention (report to your doctor or health care professional if they continue or are bothersome): -fever -headache -muscle aches and pains -pain, tenderness, redness, or swelling at site where injected -weak or tired This list may not describe all possible side effects. Call your doctor for medical advice about side effects. You may report side effects to FDA at 1-800-FDA-1088. Where should I keep my medicine? This vaccine is only given in a clinic, pharmacy, doctor's office, or other health care setting and will not  be stored at home. NOTE: This sheet is a summary. It may not cover all possible information. If you have questions about this medicine, talk to your doctor, pharmacist, or health care provider.  2015, Elsevier/Gold Standard. (2007-09-05 09:30:40)

## 2013-11-14 NOTE — Addendum Note (Signed)
Addended by: Berna Spare A on: 11/14/2013 12:27 PM   Modules accepted: Orders

## 2013-11-15 ENCOUNTER — Telehealth: Payer: Self-pay | Admitting: *Deleted

## 2013-11-15 NOTE — Telephone Encounter (Signed)
I would wait until her post op visit

## 2013-11-15 NOTE — Telephone Encounter (Signed)
(  JF patient) pt had LEEP done on 11/14/13 she asked when can she start having sex again? Pleas advise

## 2013-11-15 NOTE — Telephone Encounter (Signed)
Dr.Fernandez please see the below note. Do you recommend the same time frame 2 weeks?

## 2013-11-15 NOTE — Telephone Encounter (Signed)
Pt informed with the below note, pt asked to ask Dr.Fernandez the same question.

## 2013-11-15 NOTE — Telephone Encounter (Signed)
2 weeks.  

## 2013-11-18 NOTE — Telephone Encounter (Signed)
Left the below message on pt voicemail.

## 2013-11-19 ENCOUNTER — Telehealth: Payer: Self-pay

## 2013-11-19 ENCOUNTER — Other Ambulatory Visit: Payer: Self-pay | Admitting: Gynecology

## 2013-11-19 MED ORDER — CLINDAMYCIN PHOSPHATE 2 % VA CREA: 1.0000 | TOPICAL_CREAM | Freq: Every day | VAGINAL | Status: DC

## 2013-11-19 NOTE — Telephone Encounter (Signed)
Not unusual first 3-4 weeks after LEEP. Reassure. No intercourse until after office visit follow up. If bleeding like a menstrual cycle then i would need to see her before postop scheduled visit.

## 2013-11-19 NOTE — Telephone Encounter (Signed)
Patient had LEEP 11/14/13.  Reports some spotting onto pad and when she goes to bathroom toilet water pink and a little on tissue.

## 2013-11-19 NOTE — Telephone Encounter (Signed)
Patient informed. 

## 2013-11-19 NOTE — Telephone Encounter (Signed)
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 Will see her for previously scheduled post op visit. ------

## 2013-12-12 ENCOUNTER — Ambulatory Visit (INDEPENDENT_AMBULATORY_CARE_PROVIDER_SITE_OTHER): Payer: BC Managed Care – PPO | Admitting: Gynecology

## 2013-12-12 ENCOUNTER — Encounter: Payer: Self-pay | Admitting: Gynecology

## 2013-12-12 VITALS — BP 158/96

## 2013-12-12 DIAGNOSIS — B373 Candidiasis of vulva and vagina: Secondary | ICD-10-CM

## 2013-12-12 DIAGNOSIS — Z09 Encounter for follow-up examination after completed treatment for conditions other than malignant neoplasm: Secondary | ICD-10-CM

## 2013-12-12 DIAGNOSIS — B9689 Other specified bacterial agents as the cause of diseases classified elsewhere: Secondary | ICD-10-CM

## 2013-12-12 DIAGNOSIS — N760 Acute vaginitis: Secondary | ICD-10-CM

## 2013-12-12 DIAGNOSIS — A499 Bacterial infection, unspecified: Secondary | ICD-10-CM

## 2013-12-12 DIAGNOSIS — B3731 Acute candidiasis of vulva and vagina: Secondary | ICD-10-CM

## 2013-12-12 DIAGNOSIS — N898 Other specified noninflammatory disorders of vagina: Secondary | ICD-10-CM

## 2013-12-12 LAB — WET PREP FOR TRICH, YEAST, CLUE: TRICH WET PREP: NONE SEEN

## 2013-12-12 MED ORDER — FLUCONAZOLE 150 MG PO TABS: 150.0000 mg | ORAL_TABLET | Freq: Once | ORAL | Status: DC

## 2013-12-12 MED ORDER — TINIDAZOLE 500 MG PO TABS: ORAL_TABLET | ORAL | Status: DC

## 2013-12-12 NOTE — Progress Notes (Signed)
   Patient presented to the office today for 4 weeks postop visit after having had a LEEP cervical conization. Her history is as follows:  Pap smear August 15:  ATYPICAL SQUAMOUS CELLS OF UNDETERMINED SIGNIFICANCE (ASC-US).  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 Endocervix, curettage  - SCANT FRAGMENTS OF BENIGN ENDOCERVICAL MUCOSA, SEE COMMENT  2. Cervix, biopsy, 6 o'clock  - LOW GRADE SQUAMOUS INTRAEPITHELIAL LESION, CIN-I (MILD DYSPLASIA), SEE COMMENT.  3. Cervix, biopsy, 12 o'clock  - LOW AND HIGH GRADE SQUAMOUS INTRAEPITHELIAL LESION, CIN I AND II (MILD AND  MODERATE DYSPLASIA),  On 11/14/2013 patient with LEEP cervical conization and pathology report demonstrated the following:  Diagnosis Cervix, LEEP - HIGH GRADE SQUAMOUS INTRAEPITHELIAL LESION, CIN-II (MODERATE DYSPLASIA) AND ASSOCIATED LOW GRADE SQUAMOUS INTRAEPITHELIAL LESION, CIN-I (MILD DYSPLASIA). - RESECTION MARGINS ARE NEGATIVE FOR DYSPLASIA.  Exam: Bartholin urethra Skene was within normal limits Vagina: Clear discharge noted Cervix: Cervical bed almost completely healed. 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 Clinical evidence of bacterial vaginosis and yeast. Patient will be prescribed Diflucan 150 mg one by mouth today. She will also take Tindamax 500 mg tablets 4 today and 4 tomorrow. Patient may resume intercourse in 4 weeks. Annual exam with Pap smear in 2016.

## 2013-12-12 NOTE — Patient Instructions (Signed)
Monilial Vaginitis Vaginitis in a soreness, swelling and redness (inflammation) of the vagina and vulva. Monilial vaginitis is not a sexually transmitted infection. CAUSES  Yeast vaginitis is caused by yeast (candida) that is normally found in your vagina. With a yeast infection, the candida has overgrown in number to a point that upsets the chemical balance. SYMPTOMS   White, thick vaginal discharge.  Swelling, itching, redness and irritation of the vagina and possibly the lips of the vagina (vulva).  Burning or painful urination.  Painful intercourse. DIAGNOSIS  Things that may contribute to monilial vaginitis are:  Postmenopausal and virginal states.  Pregnancy.  Infections.  Being tired, sick or stressed, especially if you had monilial vaginitis in the past.  Diabetes. Good control will help lower the chance.  Birth control pills.  Tight fitting garments.  Using bubble bath, feminine sprays, douches or deodorant tampons.  Taking certain medications that kill germs (antibiotics).  Sporadic recurrence can occur if you become ill. TREATMENT  Your caregiver will give you medication.  There are several kinds of anti monilial vaginal creams and suppositories specific for monilial vaginitis. For recurrent yeast infections, use a suppository or cream in the vagina 2 times a week, or as directed.  Anti-monilial or steroid cream for the itching or irritation of the vulva may also be used. Get your caregiver's permission.  Painting the vagina with methylene blue solution may help if the monilial cream does not work.  Eating yogurt may help prevent monilial vaginitis. HOME CARE INSTRUCTIONS   Finish all medication as prescribed.  Do not have sex until treatment is completed or after your caregiver tells you it is okay.  Take warm sitz baths.  Do not douche.  Do not use tampons, especially scented ones.  Wear cotton underwear.  Avoid tight pants and panty  hose.  Tell your sexual partner that you have a yeast infection. They should go to their caregiver if they have symptoms such as mild rash or itching.  Your sexual partner should be treated as well if your infection is difficult to eliminate.  Practice safer sex. Use condoms.  Some vaginal medications cause latex condoms to fail. Vaginal medications that harm condoms are:  Cleocin cream.  Butoconazole (Femstat).  Terconazole (Terazol) vaginal suppository.  Miconazole (Monistat) (may be purchased over the counter). SEEK MEDICAL CARE IF:   You have a temperature by mouth above 102 F (38.9 C).  The infection is getting worse after 2 days of treatment.  The infection is not getting better after 3 days of treatment.  You develop blisters in or around your vagina.  You develop vaginal bleeding, and it is not your menstrual period.  You have pain when you urinate.  You develop intestinal problems.  You have pain with sexual intercourse. Document Released: 11/17/2004 Document Revised: 05/02/2011 Document Reviewed: 08/01/2008 ExitCare Patient Information 2015 [employer]. This information is not intended to replace advice given to you by your health care provider. Make sure you discuss any questions you have with your health care provider. Tinidazole tablets What is this medicine? TINIDAZOLE (tye NI da zole) is an antiinfective. It is used to treat amebiasis, giardiasis, trichomoniasis, and vaginosis. It will not work for colds, flu, or other viral infections. This medicine may be used for other purposes; ask your health care provider or pharmacist if you have questions. COMMON BRAND NAME(S): Tindamax What should I tell my health care provider before I take this medicine? They need to know if you have any   of these conditions: -anemia or other blood disorders -if you frequently drink alcohol containing drinks -receiving hemodialysis -seizure disorder -an unusual or  allergic reaction to tinidazole, other medicines, foods, dyes, or preservatives -pregnant or trying to get pregnant -breast-feeding How should I use this medicine? Take this medicine by mouth with a full glass of water. Follow the directions on the prescription label. Take with food. Take your medicine at regular intervals. Do not take your medicine more often than directed. Take all of your medicine as directed even if you think you are better. Do not skip doses or stop your medicine early. Talk to your pediatrician regarding the use of this medicine in children. While this drug may be prescribed for children as young as 3 years of age for selected conditions, precautions do apply. Overdosage: If you think you have taken too much of this medicine contact a poison control center or emergency room at once. NOTE: This medicine is only for you. Do not share this medicine with others. What if I miss a dose? If you miss a dose, take it as soon as you can. If it is almost time for your next dose, take only that dose. Do not take double or extra doses. What may interact with this medicine? Do not take this medicine with any of the following medications: -alcohol or any product that contains alcohol -amprenavir oral solution -disulfiram -paclitaxel injection -ritonavir oral solution -sertraline oral solution -sulfamethoxazole-trimethoprim injection This medicine may also interact with the following medications: -cholestyramine -cimetidine -conivaptan -cyclosporin -fluorouracil -fosphenytoin, phenytoin -ketoconazole -lithium -phenobarbital -tacrolimus -warfarin This list may not describe all possible interactions. Give your health care provider a list of all the medicines, herbs, non-prescription drugs, or dietary supplements you use. Also tell them if you smoke, drink alcohol, or use illegal drugs. Some items may interact with your medicine. What should I watch for while using this  medicine? Tell your doctor or health care professional if your symptoms do not improve or if they get worse. Avoid alcoholic drinks while you are taking this medicine and for three days afterward. Alcohol may make you feel dizzy, sick, or flushed. If you are being treated for a sexually transmitted disease, avoid sexual contact until you have finished your treatment. Your sexual partner may also need treatment. What side effects may I notice from receiving this medicine? Side effects that you should report to your doctor or health care professional as soon as possible: -allergic reactions like skin rash, itching or hives, swelling of the face, lips, or tongue -breathing problems -confusion, depression -dark or white patches in the mouth -feeling faint or lightheaded, falls -fever, infection -numbness, tingling, pain or weakness in the hands or feet -pain when passing urine -seizures -unusually weak or tired -vaginal irritation or discharge -vomiting Side effects that usually do not require medical attention (report to your doctor or health care professional if they continue or are bothersome): -dark brown or reddish urine -diarrhea -headache -loss of appetite -metallic taste -nausea -stomach upset This list may not describe all possible side effects. Call your doctor for medical advice about side effects. You may report side effects to FDA at 1-800-FDA-1088. Where should I keep my medicine? Keep out of the reach of children. Store at room temperature between 15 and 30 degrees C (59 and 86 degrees F). Protect from light and moisture. Keep container tightly closed. Throw away any unused medicine after the expiration date. NOTE: This sheet is a summary. It may not cover all   possible information. If you have questions about this medicine, talk to your doctor, pharmacist, or health care provider.  2015, Elsevier/Gold Standard. (2007-11-05 15:22:28) Bacterial Vaginosis Bacterial vaginosis  is a vaginal infection that occurs when the normal balance of bacteria in the vagina is disrupted. It results from an overgrowth of certain bacteria. This is the most common vaginal infection in women of childbearing age. Treatment is important to prevent complications, especially in pregnant women, as it can cause a premature delivery. CAUSES  Bacterial vaginosis is caused by an increase in harmful bacteria that are normally present in smaller amounts in the vagina. Several different kinds of bacteria can cause bacterial vaginosis. However, the reason that the condition develops is not fully understood. RISK FACTORS Certain activities or behaviors can put you at an increased risk of developing bacterial vaginosis, including:  Having a new sex partner or multiple sex partners.  Douching.  Using an intrauterine device (IUD) for contraception. Women do not get bacterial vaginosis from toilet seats, bedding, swimming pools, or contact with objects around them. SIGNS AND SYMPTOMS  Some women with bacterial vaginosis have no signs or symptoms. Common symptoms include:  Grey vaginal discharge.  A fishlike odor with discharge, especially after sexual intercourse.  Itching or burning of the vagina and vulva.  Burning or pain with urination. DIAGNOSIS  Your health care provider will take a medical history and examine the vagina for signs of bacterial vaginosis. A sample of vaginal fluid may be taken. Your health care provider will look at this sample under a microscope to check for bacteria and abnormal cells. A vaginal pH test may also be done.  TREATMENT  Bacterial vaginosis may be treated with antibiotic medicines. These may be given in the form of a pill or a vaginal cream. A second round of antibiotics may be prescribed if the condition comes back after treatment.  HOME CARE INSTRUCTIONS   Only take over-the-counter or prescription medicines as directed by your health care provider.  If  antibiotic medicine was prescribed, take it as directed. Make sure you finish it even if you start to feel better.  Do not have sex until treatment is completed.  Tell all sexual partners that you have a vaginal infection. They should see their health care provider and be treated if they have problems, such as a mild rash or itching.  Practice safe sex by using condoms and only having one sex partner. SEEK MEDICAL CARE IF:   Your symptoms are not improving after 3 days of treatment.  You have increased discharge or pain.  You have a fever. MAKE SURE YOU:   Understand these instructions.  Will watch your condition.  Will get help right away if you are not doing well or get worse. FOR MORE INFORMATION  Centers for Disease Control and Prevention, Division of STD Prevention: www.cdc.gov/std American Sexual Health Association (ASHA): www.ashastd.org  Document Released: 02/07/2005 Document Revised: 11/28/2012 Document Reviewed: 09/19/2012 ExitCare Patient Information 2015 [employer]. This information is not intended to replace advice given to you by your health care provider. Make sure you discuss any questions you have with your health care provider.  

## 2013-12-18 ENCOUNTER — Telehealth: Payer: Self-pay | Admitting: *Deleted

## 2013-12-18 NOTE — Telephone Encounter (Signed)
Pt informed with the below note. 

## 2013-12-18 NOTE — Telephone Encounter (Signed)
Pt has post op LEEP appointment on 12/12/13 pt said she spoke with you about some bleeding. Pt was prescribed Diflucan 150 mg & Tindamax 500 mg tablets 4 today and 4 tomorrow. Pt has not picked up Rx due to family health issues. States this am in shower 2 quarter size clots came out followed by bleeding. She is bleeding now, but period flow, not heavy. She asked if watch for now? Work in for OV? Rx for bleeding? Please advise

## 2013-12-18 NOTE — Telephone Encounter (Signed)
It sounds to me like this is the time for her menses. When I saw her recently she was 4 weeks out from her surgery and the cervix almost completely healed. We can watch for now. She did take ibuprofen every 6-8 hours which were cut down the bleeding and cramping from her cycle. But persist or any question she is welcome to come to the office and I will be more than glad to look at her

## 2014-03-07 ENCOUNTER — Other Ambulatory Visit: Payer: Self-pay

## 2014-03-07 DIAGNOSIS — Z803 Family history of malignant neoplasm of breast: Secondary | ICD-10-CM

## 2014-03-07 DIAGNOSIS — Z1231 Encounter for screening mammogram for malignant neoplasm of breast: Secondary | ICD-10-CM

## 2014-08-15 ENCOUNTER — Ambulatory Visit (INDEPENDENT_AMBULATORY_CARE_PROVIDER_SITE_OTHER): Payer: BLUE CROSS/BLUE SHIELD | Admitting: Women's Health

## 2014-08-15 ENCOUNTER — Ambulatory Visit: Payer: Self-pay | Admitting: Women's Health

## 2014-08-15 ENCOUNTER — Encounter: Payer: Self-pay | Admitting: Women's Health

## 2014-08-15 VITALS — BP 142/82 | Ht 67.0 in | Wt 195.0 lb

## 2014-08-15 DIAGNOSIS — Z113 Encounter for screening for infections with a predominantly sexual mode of transmission: Secondary | ICD-10-CM

## 2014-08-15 DIAGNOSIS — N871 Moderate cervical dysplasia: Secondary | ICD-10-CM

## 2014-08-15 LAB — HEPATITIS B SURFACE ANTIGEN: Hepatitis B Surface Ag: NEGATIVE

## 2014-08-15 LAB — HEPATITIS C ANTIBODY: HCV AB: NEGATIVE

## 2014-08-15 NOTE — Progress Notes (Signed)
Patient ID: [name], female   DOB: 24-Mar-1975, 39 y.o.   MRN: [id number] Presents to discuss birth control options and for STD screening. Not sexually active greater than one year. Starting a new relationship. Regular monthly cycle. Denies vaginal discharge or any problems at this time.  Exam: Appears well. External genitalia within normal limits, speculum exam scant discharge GC/Chlamydia culture taken. Bimanual no CMT or adnexal tenderness.  STD screen Contraception management  Plan: GC/Chlamydia, HIV, hep B, C, RPR. Options reviewed blood pressure slightly elevated, will check away from office if continues greater than 130/80 follow-up with primary care. Mirena IUD information given and reviewed slight risk for infection, perforation, hemorrhage. Dr. Lily Peer to place with cycle, will check coverage will schedule appointment with next cycle. Condoms encouraged until permanent partner.

## 2014-08-15 NOTE — Patient Instructions (Signed)
Levonorgestrel intrauterine device (IUD) What is this medicine? LEVONORGESTREL IUD (LEE voe nor jes trel) is a contraceptive (birth control) device. The device is placed inside the uterus by a healthcare professional. It is used to prevent pregnancy and can also be used to treat heavy bleeding that occurs during your period. Depending on the device, it can be used for 3 to 5 years. This medicine may be used for other purposes; ask your health care provider or pharmacist if you have questions. COMMON BRAND NAME(S): Verda Cumins What should I tell my health care provider before I take this medicine? They need to know if you have any of these conditions: -abnormal Pap smear -cancer of the breast, uterus, or cervix -diabetes -endometritis -genital or pelvic infection now or in the past -have more than one sexual partner or your partner has more than one partner -heart disease -history of an ectopic or tubal pregnancy -immune system problems -IUD in place -liver disease or tumor -problems with blood clots or take blood-thinners -use intravenous drugs -uterus of unusual shape -vaginal bleeding that has not been explained -an unusual or allergic reaction to levonorgestrel, other hormones, silicone, or polyethylene, medicines, foods, dyes, or preservatives -pregnant or trying to get pregnant -breast-feeding How should I use this medicine? This device is placed inside the uterus by a health care professional. Talk to your pediatrician regarding the use of this medicine in children. Special care may be needed. Overdosage: If you think you have taken too much of this medicine contact a poison control center or emergency room at once. NOTE: This medicine is only for you. Do not share this medicine with others. What if I miss a dose? This does not apply. What may interact with this medicine? Do not take this medicine with any of the following  medications: -amprenavir -bosentan -fosamprenavir This medicine may also interact with the following medications: -aprepitant -barbiturate medicines for inducing sleep or treating seizures -bexarotene -griseofulvin -medicines to treat seizures like carbamazepine, ethotoin, felbamate, oxcarbazepine, phenytoin, topiramate -modafinil -pioglitazone -rifabutin -rifampin -rifapentine -some medicines to treat HIV infection like atazanavir, indinavir, lopinavir, nelfinavir, tipranavir, ritonavir -St. John's wort -warfarin This list may not describe all possible interactions. Give your health care provider a list of all the medicines, herbs, non-prescription drugs, or dietary supplements you use. Also tell them if you smoke, drink alcohol, or use illegal drugs. Some items may interact with your medicine. What should I watch for while using this medicine? Visit your doctor or health care professional for regular check ups. See your doctor if you or your partner has sexual contact with others, becomes HIV positive, or gets a sexual transmitted disease. This product does not protect you against HIV infection (AIDS) or other sexually transmitted diseases. You can check the placement of the IUD yourself by reaching up to the top of your vagina with clean fingers to feel the threads. Do not pull on the threads. It is a good habit to check placement after each menstrual period. Call your doctor right away if you feel more of the IUD than just the threads or if you cannot feel the threads at all. The IUD may come out by itself. You may become pregnant if the device comes out. If you notice that the IUD has come out use a backup birth control method like condoms and call your health care provider. Using tampons will not change the position of the IUD and are okay to use during your period. What side effects may  I notice from receiving this medicine? Side effects that you should report to your doctor or  health care professional as soon as possible: -allergic reactions like skin rash, itching or hives, swelling of the face, lips, or tongue -fever, flu-like symptoms -genital sores -high blood pressure -no menstrual period for 6 weeks during use -pain, swelling, warmth in the leg -pelvic pain or tenderness -severe or sudden headache -signs of pregnancy -stomach cramping -sudden shortness of breath -trouble with balance, talking, or walking -unusual vaginal bleeding, discharge -yellowing of the eyes or skin Side effects that usually do not require medical attention (report to your doctor or health care professional if they continue or are bothersome): -acne -breast pain -change in sex drive or performance -changes in weight -cramping, dizziness, or faintness while the device is being inserted -headache -irregular menstrual bleeding within first 3 to 6 months of use -nausea This list may not describe all possible side effects. Call your doctor for medical advice about side effects. You may report side effects to FDA at 1-800-FDA-1088. Where should I keep my medicine? This does not apply. NOTE: This sheet is a summary. It may not cover all possible information. If you have questions about this medicine, talk to your doctor, pharmacist, or health care provider.  2015, [employer]vier/Gold Standard. (2011-03-10 13:54:04)  

## 2014-08-16 LAB — HIV ANTIBODY (ROUTINE TESTING W REFLEX): HIV 1&2 Ab, 4th Generation: NONREACTIVE

## 2014-08-16 LAB — GC/CHLAMYDIA PROBE AMP
CT Probe RNA: NEGATIVE
GC Probe RNA: NEGATIVE

## 2014-08-16 LAB — RPR

## 2014-08-19 ENCOUNTER — Ambulatory Visit: Payer: BLUE CROSS/BLUE SHIELD | Admitting: Gynecology

## 2014-08-22 ENCOUNTER — Telehealth: Payer: Self-pay | Admitting: *Deleted

## 2014-08-22 NOTE — Telephone Encounter (Signed)
Pt informed with negative STD results for OV 08/15/14

## 2014-10-07 ENCOUNTER — Encounter: Payer: Self-pay | Admitting: Gynecology

## 2014-10-07 ENCOUNTER — Ambulatory Visit (INDEPENDENT_AMBULATORY_CARE_PROVIDER_SITE_OTHER): Payer: BLUE CROSS/BLUE SHIELD | Admitting: Gynecology

## 2014-10-07 ENCOUNTER — Encounter: Payer: Self-pay | Admitting: Women's Health

## 2014-10-07 VITALS — BP 128/84

## 2014-10-07 DIAGNOSIS — Z975 Presence of (intrauterine) contraceptive device: Secondary | ICD-10-CM | POA: Insufficient documentation

## 2014-10-07 DIAGNOSIS — Z30430 Encounter for insertion of intrauterine contraceptive device: Secondary | ICD-10-CM

## 2014-10-07 NOTE — Progress Notes (Signed)
   Patient is a 39 year old who presented to the office today for placement of Mirena IUD. She had been using condoms for contraception which she's been sexually active. She does suffer from heavy cycles although regular. She recently had a full STD screen because she's had a new partner over the past year and it was negative. 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  Procedure note:                                                                    IUD procedure note       Patient presented to the office today for placement of Mirena IUD. The patient had previously been provided with literature information on this method of contraception. The risks benefits and pros and cons were discussed and all her questions were answered. She is fully aware that this form of contraception is 99% effective and is good for 5 years.  Pelvic exam: Bartholin urethra Skene glands: Within normal limits Vagina: No lesions or discharge Cervix: No lesions or discharge Uterus: Retroverted position Adnexa: No masses or tenderness Rectal exam: Not done  The cervix was cleansed with Betadine solution. A single-tooth tenaculum was placed on the anterior cervical lip. The uterus sounded to 7-1/2 centimeter. The IUD was shown to the patient and inserted in a sterile fashion. The IUD string was trimmed. The single-tooth tenaculum was removed. 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     lot number [id number]

## 2014-10-07 NOTE — Patient Instructions (Signed)

## 2014-10-09 ENCOUNTER — Encounter: Payer: Self-pay | Admitting: Gynecology

## 2014-11-10 ENCOUNTER — Ambulatory Visit: Payer: BLUE CROSS/BLUE SHIELD | Admitting: Gynecology

## 2015-08-31 ENCOUNTER — Other Ambulatory Visit: Payer: Self-pay | Admitting: Women's Health

## 2015-08-31 ENCOUNTER — Other Ambulatory Visit: Payer: Self-pay | Admitting: Gynecology

## 2015-08-31 DIAGNOSIS — Z1231 Encounter for screening mammogram for malignant neoplasm of breast: Secondary | ICD-10-CM

## 2016-01-20 ENCOUNTER — Encounter: Payer: Self-pay | Admitting: Gynecology

## 2016-01-20 ENCOUNTER — Ambulatory Visit (INDEPENDENT_AMBULATORY_CARE_PROVIDER_SITE_OTHER): Payer: BLUE CROSS/BLUE SHIELD | Admitting: Gynecology

## 2016-01-20 VITALS — BP 124/82 | Ht 67.0 in | Wt 195.0 lb

## 2016-01-20 DIAGNOSIS — N644 Mastodynia: Secondary | ICD-10-CM | POA: Insufficient documentation
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
## 2016-01-21 ENCOUNTER — Telehealth: Payer: Self-pay | Admitting: *Deleted

## 2016-01-21 DIAGNOSIS — N644 Mastodynia: Secondary | ICD-10-CM

## 2016-01-21 NOTE — Telephone Encounter (Signed)
-----   Message from Ok EdwardsJuan H Fernandez, MD sent at 01/20/2016 12:47 PM EST ----- Please scheduled diagnostic mammogram of right breat for this patient. Tenderness for 2 weeks right upper outer quadrant. Screening 3 D of left breast. She has saline implants and mother had breast cancer.

## 2016-01-21 NOTE — Telephone Encounter (Signed)
Orders placed at the breast center they will contact to schedule.

## 2016-01-22 ENCOUNTER — Other Ambulatory Visit: Payer: Self-pay | Admitting: Gynecology

## 2016-01-25 NOTE — Telephone Encounter (Signed)
Appointment 02/02/16 @ 1:30

## 2016-02-02 ENCOUNTER — Ambulatory Visit
Admission: RE | Admit: 2016-02-02 | Discharge: 2016-02-02 | Disposition: A | Discharge status: Home / Self Care | Payer: BLUE CROSS/BLUE SHIELD | Source: Ambulatory Visit | Attending: Gynecology | Admitting: Gynecology

## 2016-02-02 ENCOUNTER — Other Ambulatory Visit: Payer: Self-pay

## 2016-02-02 DIAGNOSIS — N644 Mastodynia: Secondary | ICD-10-CM

## 2016-02-09 ENCOUNTER — Encounter: Payer: Self-pay | Admitting: Women's Health

## 2016-02-09 ENCOUNTER — Ambulatory Visit (INDEPENDENT_AMBULATORY_CARE_PROVIDER_SITE_OTHER): Payer: BLUE CROSS/BLUE SHIELD | Admitting: Women's Health

## 2016-02-09 VITALS — BP 122/78 | Ht 67.0 in | Wt 200.0 lb

## 2016-02-09 DIAGNOSIS — Z13220 Encounter for screening for lipoid disorders: Secondary | ICD-10-CM | POA: Diagnosis not present

## 2016-02-09 DIAGNOSIS — Z01419 Encounter for gynecological examination (general) (routine) without abnormal findings: Secondary | ICD-10-CM | POA: Diagnosis not present

## 2016-02-09 DIAGNOSIS — Z1329 Encounter for screening for other suspected endocrine disorder: Secondary | ICD-10-CM | POA: Diagnosis not present

## 2016-02-09 DIAGNOSIS — J029 Acute pharyngitis, unspecified: Secondary | ICD-10-CM | POA: Diagnosis not present

## 2016-02-09 LAB — CBC WITH DIFFERENTIAL/PLATELET
BASOS PCT: 0 %
Basophils Absolute: 0 cells/uL (ref 0–200)
EOS ABS: 135 {cells}/uL (ref 15–500)
Eosinophils Relative: 3 %
HEMATOCRIT: 44.6 % (ref 35.0–45.0)
Hemoglobin: 15.4 g/dL (ref 11.7–15.5)
Lymphocytes Relative: 23 %
Lymphs Abs: 1035 cells/uL (ref 850–3900)
MCH: 33.3 pg — ABNORMAL HIGH (ref 27.0–33.0)
MCHC: 34.5 g/dL (ref 32.0–36.0)
MCV: 96.5 fL (ref 80.0–100.0)
MONO ABS: 540 {cells}/uL (ref 200–950)
MONOS PCT: 12 %
MPV: 9.3 fL (ref 7.5–12.5)
NEUTROS ABS: 2790 {cells}/uL (ref 1500–7800)
Neutrophils Relative %: 62 %
PLATELETS: 293 10*3/uL (ref 140–400)
RBC: 4.62 MIL/uL (ref 3.80–5.10)
RDW: 13 % (ref 11.0–15.0)
WBC: 4.5 10*3/uL (ref 3.8–10.8)

## 2016-02-09 LAB — COMPREHENSIVE METABOLIC PANEL
ALK PHOS: 80 U/L (ref 33–115)
ALT: 32 U/L — AB (ref 6–29)
AST: 23 U/L (ref 10–30)
Albumin: 4.3 g/dL (ref 3.6–5.1)
BILIRUBIN TOTAL: 0.4 mg/dL (ref 0.2–1.2)
BUN: 10 mg/dL (ref 7–25)
CO2: 23 mmol/L (ref 20–31)
CREATININE: 0.98 mg/dL (ref 0.50–1.10)
Calcium: 8.8 mg/dL (ref 8.6–10.2)
Chloride: 102 mmol/L (ref 98–110)
GLUCOSE: 84 mg/dL (ref 65–99)
POTASSIUM: 4.4 mmol/L (ref 3.5–5.3)
Sodium: 138 mmol/L (ref 135–146)
Total Protein: 7 g/dL (ref 6.1–8.1)

## 2016-02-09 LAB — LIPID PANEL
CHOL/HDL RATIO: 4.4 ratio (ref <5.0)
Cholesterol: 179 mg/dL (ref <200)
HDL: 41 mg/dL — ABNORMAL LOW (ref >50)
LDL Cholesterol: 61 mg/dL (ref <100)
Triglycerides: 387 mg/dL — ABNORMAL HIGH (ref <150)
VLDL: 77 mg/dL — ABNORMAL HIGH (ref <30)

## 2016-02-09 LAB — TSH: TSH: 1.45 m[IU]/L

## 2016-02-09 NOTE — Patient Instructions (Signed)

## 2016-02-09 NOTE — Progress Notes (Signed)
[name] 31-Oct-1975 [id number]    History:    Presents for annual exam.  09/2014 Mirena IUD amenorrheic. 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 Normal mammogram history. Same partner/negative STD screen. Back surgery 05/2015 from work related injury , [employer] from [employer].  Past medical history, past surgical history, family history and social history were all reviewed and documented in the EPIC chart. [name] 15, [place] 14 both doing well have both received gardasil. Mother died from metastatic cancer 2012.  ROS:  A ROS was performed and pertinent positives and negatives are included.  Exam:  Vitals:   02/09/16 1041  BP: 122/78  Weight: 200 lb (90.7 kg)  Height: 5\' 7"  (1.702 m)   Body mass index is 31.32 kg/m.   General appearance:  Normal Thyroid:  Symmetrical, normal in size, without palpable masses or nodularity. Respiratory  Auscultation:  Clear without wheezing or rhonchi Cardiovascular  Auscultation:  Regular rate, without rubs, murmurs or gallops  Edema/varicosities:  Not grossly evident Abdominal  Soft,nontender, without masses, guarding or rebound.  Liver/spleen:  No organomegaly noted  Hernia:  None appreciated  Skin  Inspection:  Grossly normal   Breasts: Examined lying and sitting. Bilateral implants    Right: Without masses, retractions, discharge or axillary adenopathy.     Left: Without masses, retractions, discharge or axillary adenopathy. Gentitourinary   Inguinal/mons:  Normal without inguinal adenopathy  External genitalia:  Normal  BUS/Urethra/Skene's glands:  Normal  Vagina:  Normal  Cervix:  NormalIUD strings visible  Uterus:   normal in size, shape and contour.  Midline and mobile  Adnexa/parametria:     Rt: Without masses or tenderness.   Lt: Without masses or tenderness.  Anus and perineum: Normal  Digital rectal exam: Normal sphincter tone without palpated masses or tenderness  Assessment/Plan:  40 y.o. DWF G2 P2  for annual exam with  complaint of sore throat/without fever /daughter with strep throat.  09/2014 Mirena IUD-amenorrhea 10/2013 LEEP cone for CIN-2 05/2015 Back surgery-neurosurgeon managing  Plan: Strep culture from throat taken / pending. SBE's, continue annual 3-D screening mammogram/history of dense breasts, calcium rich diet, vitamin D 1000 daily encouraged. CBC, lipid panel, CMP, UA, Pap with HR HPV typing.       Harrington ChallengerYOUNG,[name] J WHNP, 11:18 AM 02/09/2016

## 2016-02-09 NOTE — Addendum Note (Signed)
Addended by: Kem ParkinsonBARNES, Nickolis Diel on: 02/09/2016 12:35 PM   Modules accepted: Orders

## 2016-02-10 ENCOUNTER — Telehealth: Payer: Self-pay | Admitting: *Deleted

## 2016-02-10 LAB — URINALYSIS W MICROSCOPIC + REFLEX CULTURE
BACTERIA UA: NONE SEEN [HPF]
Bilirubin Urine: NEGATIVE
CASTS: NONE SEEN [LPF]
Crystals: NONE SEEN [HPF]
GLUCOSE, UA: NEGATIVE
HGB URINE DIPSTICK: NEGATIVE
KETONES UR: NEGATIVE
Leukocytes, UA: NEGATIVE
Nitrite: NEGATIVE
PROTEIN: NEGATIVE
RBC / HPF: NONE SEEN RBC/HPF (ref <=2)
SQUAMOUS EPITHELIAL / LPF: NONE SEEN [HPF] (ref <=5)
Specific Gravity, Urine: 1.017 (ref 1.001–1.035)
WBC, UA: NONE SEEN WBC/HPF (ref <=5)
YEAST: NONE SEEN [HPF]
pH: 7.5 (ref 5.0–8.0)

## 2016-02-10 LAB — PAP, TP IMAGING W/ HPV RNA, RFLX HPV TYPE 16,18/45: HPV mRNA, High Risk: NOT DETECTED

## 2016-02-10 NOTE — Telephone Encounter (Signed)
Ok, z pak, take 2 tablets today and then daily for 4 days, azithromycin 20 mg #6

## 2016-02-10 NOTE — Telephone Encounter (Signed)
Pt called to see if strep-test results were back and they are not, pt said she is not feeling any better. She asked what can she take for this? Are you willing to prescribe Rx? Please advise

## 2016-02-11 ENCOUNTER — Encounter: Payer: Self-pay | Admitting: Women's Health

## 2016-02-11 ENCOUNTER — Other Ambulatory Visit: Payer: Self-pay | Admitting: Women's Health

## 2016-02-11 DIAGNOSIS — E781 Pure hyperglyceridemia: Secondary | ICD-10-CM

## 2016-02-11 LAB — CULTURE, GROUP A STREP: Organism ID, Bacteria: NORMAL

## 2016-02-11 MED ORDER — OMEGA-3-ACID ETHYL ESTERS 1 G PO CAPS: 2.0000 g | ORAL_CAPSULE | Freq: Every day | ORAL | 3 refills | Status: DC

## 2016-02-29 ENCOUNTER — Encounter: Payer: BLUE CROSS/BLUE SHIELD | Admitting: Gynecology

## 2016-07-06 ENCOUNTER — Encounter: Payer: Self-pay | Admitting: Gynecology

## 2017-09-06 ENCOUNTER — Other Ambulatory Visit: Payer: Self-pay | Admitting: Gynecology

## 2017-09-06 DIAGNOSIS — Z1231 Encounter for screening mammogram for malignant neoplasm of breast: Secondary | ICD-10-CM

## 2017-09-27 ENCOUNTER — Ambulatory Visit
Admission: RE | Admit: 2017-09-27 | Discharge: 2017-09-27 | Disposition: A | Discharge status: Home / Self Care | Payer: BLUE CROSS/BLUE SHIELD | Source: Ambulatory Visit | Attending: Nurse Practitioner | Admitting: Nurse Practitioner

## 2017-09-27 DIAGNOSIS — Z1231 Encounter for screening mammogram for malignant neoplasm of breast: Secondary | ICD-10-CM

## 2018-03-29 ENCOUNTER — Other Ambulatory Visit: Payer: Self-pay | Admitting: Orthopedic Surgery

## 2018-03-29 DIAGNOSIS — M961 Postlaminectomy syndrome, not elsewhere classified: Secondary | ICD-10-CM

## 2018-04-17 NOTE — Discharge Instructions (Signed)
Discogram Post Procedure Discharge Instructions ° °1. May resume a regular diet and any medications that you routinely take (including pain medications). °2. No driving day of procedure. °3. Upon discharge go home and rest for at least 4 hours.  May use an ice pack as needed to injection sites on back.  Ice to back 30 minutes on and 30 minutes off, all day. °4. May remove bandades later, today. °5. It is not unusual to be sore for several days after this procedure. ° ° ° °Please contact our office at [phone] for the following symptoms: ° °· Fever greater than 100 degrees °· Increased swelling, pain, or redness at injection site. ° ° °Thank you for visiting [facility]. ° ° °

## 2018-04-18 ENCOUNTER — Ambulatory Visit
Admission: RE | Admit: 2018-04-18 | Discharge: 2018-04-18 | Disposition: A | Discharge status: Home / Self Care | Payer: Worker's Compensation | Source: Ambulatory Visit | Attending: Orthopedic Surgery | Admitting: Orthopedic Surgery

## 2018-04-18 ENCOUNTER — Other Ambulatory Visit: Payer: Self-pay | Admitting: Orthopedic Surgery

## 2018-04-18 ENCOUNTER — Ambulatory Visit
Admission: RE | Admit: 2018-04-18 | Discharge: 2018-04-18 | Disposition: A | Discharge status: Home / Self Care | Payer: Self-pay | Source: Ambulatory Visit | Attending: Orthopedic Surgery | Admitting: Orthopedic Surgery

## 2018-04-18 DIAGNOSIS — M961 Postlaminectomy syndrome, not elsewhere classified: Secondary | ICD-10-CM

## 2018-04-18 MED ORDER — IOPAMIDOL (ISOVUE-M 200) INJECTION 41%
3.5000 mL | Freq: Once | INTRAMUSCULAR | Status: AC
  Administered 2018-04-18: 3.5 mL

## 2018-04-18 MED ORDER — KETOROLAC TROMETHAMINE 30 MG/ML IJ SOLN
30.0000 mg | Freq: Once | INTRAMUSCULAR | Status: AC
  Administered 2018-04-18: 30 mg via INTRAVENOUS

## 2018-04-18 MED ORDER — SODIUM CHLORIDE 0.9 % IV SOLN
INTRAVENOUS | Status: DC
  Administered 2018-04-18: 08:00:00 via INTRAVENOUS

## 2018-04-18 MED ORDER — MIDAZOLAM HCL 2 MG/2ML IJ SOLN
1.0000 mg | INTRAMUSCULAR | Status: DC | PRN
  Administered 2018-04-18: 1 mg via INTRAVENOUS

## 2018-04-18 MED ORDER — CEFAZOLIN SODIUM-DEXTROSE 2-4 GM/100ML-% IV SOLN
2.0000 g | INTRAVENOUS | Status: AC
  Administered 2018-04-18: 2 g via INTRAVENOUS

## 2018-04-18 MED ORDER — FENTANYL CITRATE (PF) 100 MCG/2ML IJ SOLN
25.0000 ug | INTRAMUSCULAR | Status: DC | PRN
  Administered 2018-04-18 (×2): 50 ug via INTRAVENOUS

## 2018-04-18 NOTE — Progress Notes (Signed)
Pt back in recovery room. Pt talking in complete sentences, alert and oriented. Will continue to monitor.

## 2018-05-10 ENCOUNTER — Other Ambulatory Visit: Payer: Self-pay

## 2018-05-15 ENCOUNTER — Other Ambulatory Visit: Payer: Self-pay

## 2018-05-15 ENCOUNTER — Telehealth: Payer: Self-pay | Admitting: *Deleted

## 2018-05-15 ENCOUNTER — Encounter: Payer: Self-pay | Admitting: Women's Health

## 2018-05-15 ENCOUNTER — Ambulatory Visit: Payer: BLUE CROSS/BLUE SHIELD | Admitting: Women's Health

## 2018-05-15 VITALS — BP 132/80 | Ht 67.0 in | Wt 201.0 lb

## 2018-05-15 DIAGNOSIS — Z01419 Encounter for gynecological examination (general) (routine) without abnormal findings: Secondary | ICD-10-CM

## 2018-05-15 DIAGNOSIS — Z803 Family history of malignant neoplasm of breast: Secondary | ICD-10-CM | POA: Diagnosis not present

## 2018-05-15 DIAGNOSIS — Z13220 Encounter for screening for lipoid disorders: Secondary | ICD-10-CM

## 2018-05-15 NOTE — Patient Instructions (Addendum)
Fish oil supp twice daily  Health Maintenance, Female Adopting a healthy lifestyle and getting preventive care can go a long way to promote health and wellness. Talk with your health care provider about what schedule of regular examinations is right for you. This is a good chance for you to check in with your provider about disease prevention and staying healthy. In between checkups, there are plenty of things you can do on your own. Experts have done a lot of research about which lifestyle changes and preventive measures are most likely to keep you healthy. Ask your health care provider for more information. Weight and diet Eat a healthy diet  Be sure to include plenty of vegetables, fruits, low-fat dairy products, and lean protein.  Do not eat a lot of foods high in solid fats, added sugars, or salt.  Get regular exercise. This is one of the most important things you can do for your health. ? Most adults should exercise for at least 150 minutes each week. The exercise should increase your heart rate and make you sweat (moderate-intensity exercise). ? Most adults should also do strengthening exercises at least twice a week. This is in addition to the moderate-intensity exercise. Maintain a healthy weight  Body mass index (BMI) is a measurement that can be used to identify possible weight problems. It estimates body fat based on height and weight. Your health care provider can help determine your BMI and help you achieve or maintain a healthy weight.  For females 14 years of age and older: ? A BMI below 18.5 is considered underweight. ? A BMI of 18.5 to 24.9 is normal. ? A BMI of 25 to 29.9 is considered overweight. ? A BMI of 30 and above is considered obese. Watch levels of cholesterol and blood lipids  You should start having your blood tested for lipids and cholesterol at 45 years of age, then have this test every 5 years.  You may need to have your cholesterol levels checked more  often if: ? Your lipid or cholesterol levels are high. ? You are older than 45 years of age. ? You are at high risk for heart disease. Cancer screening Lung Cancer  Lung cancer screening is recommended for adults 52-64 years old who are at high risk for lung cancer because of a history of smoking.  A yearly low-dose CT scan of the lungs is recommended for people who: ? Currently smoke. ? Have quit within the past 15 years. ? Have at least a 30-pack-year history of smoking. A pack year is smoking an average of one pack of cigarettes a day for 1 year.  Yearly screening should continue until it has been 15 years since you quit.  Yearly screening should stop if you develop a health problem that would prevent you from having lung cancer treatment. Breast Cancer  Practice breast self-awareness. This means understanding how your breasts normally appear and feel.  It also means doing regular breast self-exams. Let your health care provider know about any changes, no matter how small.  If you are in your 20s or 30s, you should have a clinical breast exam (CBE) by a health care provider every 1-3 years as part of a regular health exam.  If you are 31 or older, have a CBE every year. Also consider having a breast X-ray (mammogram) every year.  If you have a family history of breast cancer, talk to your health care provider about genetic screening.  If you are at  high risk for breast cancer, talk to your health care provider about having an MRI and a mammogram every year.  Breast cancer gene (BRCA) assessment is recommended for women who have family members with BRCA-related cancers. BRCA-related cancers include: ? Breast. ? Ovarian. ? Tubal. ? Peritoneal cancers.  Results of the assessment will determine the need for genetic counseling and BRCA1 and BRCA2 testing. Cervical Cancer Your health care provider may recommend that you be screened regularly for cancer of the pelvic organs  (ovaries, uterus, and vagina). This screening involves a pelvic examination, including checking for microscopic changes to the surface of your cervix (Pap test). You may be encouraged to have this screening done every 3 years, beginning at age 84.  For women ages 35-65, health care providers may recommend pelvic exams and Pap testing every 3 years, or they may recommend the Pap and pelvic exam, combined with testing for human papilloma virus (HPV), every 5 years. Some types of HPV increase your risk of cervical cancer. Testing for HPV may also be done on women of any age with unclear Pap test results.  Other health care providers may not recommend any screening for nonpregnant women who are considered low risk for pelvic cancer and who do not have symptoms. Ask your health care provider if a screening pelvic exam is right for you.  If you have had past treatment for cervical cancer or a condition that could lead to cancer, you need Pap tests and screening for cancer for at least 20 years after your treatment. If Pap tests have been discontinued, your risk factors (such as having a new sexual partner) need to be reassessed to determine if screening should resume. Some women have medical problems that increase the chance of getting cervical cancer. In these cases, your health care provider may recommend more frequent screening and Pap tests. Colorectal Cancer  This type of cancer can be detected and often prevented.  Routine colorectal cancer screening usually begins at 45 years of age and continues through 45 years of age.  Your health care provider may recommend screening at an earlier age if you have risk factors for colon cancer.  Your health care provider may also recommend using home test kits to check for hidden blood in the stool.  A small camera at the end of a tube can be used to examine your colon directly (sigmoidoscopy or colonoscopy). This is done to check for the earliest forms of  colorectal cancer.  Routine screening usually begins at age 54.  Direct examination of the colon should be repeated every 5-10 years through 45 years of age. However, you may need to be screened more often if early forms of precancerous polyps or small growths are found. Skin Cancer  Check your skin from head to toe regularly.  Tell your health care provider about any new moles or changes in moles, especially if there is a change in a mole's shape or color.  Also tell your health care provider if you have a mole that is larger than the size of a pencil eraser.  Always use sunscreen. Apply sunscreen liberally and repeatedly throughout the day.  Protect yourself by wearing long sleeves, pants, a wide-brimmed hat, and sunglasses whenever you are outside. Heart disease, diabetes, and high blood pressure  High blood pressure causes heart disease and increases the risk of stroke. High blood pressure is more likely to develop in: ? People who have blood pressure in the high end of the normal  range (130-139/85-89 mm Hg). ? People who are overweight or obese. ? People who are African American.  If you are 59-68 years of age, have your blood pressure checked every 3-5 years. If you are 87 years of age or older, have your blood pressure checked every year. You should have your blood pressure measured twice-once when you are at a hospital or clinic, and once when you are not at a hospital or clinic. Record the average of the two measurements. To check your blood pressure when you are not at a hospital or clinic, you can use: ? An automated blood pressure machine at a pharmacy. ? A home blood pressure monitor.  If you are between 93 years and 47 years old, ask your health care provider if you should take aspirin to prevent strokes.  Have regular diabetes screenings. This involves taking a blood sample to check your fasting blood sugar level. ? If you are at a normal weight and have a low risk for  diabetes, have this test once every three years after 45 years of age. ? If you are overweight and have a high risk for diabetes, consider being tested at a younger age or more often. Preventing infection Hepatitis B  If you have a higher risk for hepatitis B, you should be screened for this virus. You are considered at high risk for hepatitis B if: ? You were born in a country where hepatitis B is common. Ask your health care provider which countries are considered high risk. ? Your parents were born in a high-risk country, and you have not been immunized against hepatitis B (hepatitis B vaccine). ? You have HIV or AIDS. ? You use needles to inject street drugs. ? You live with someone who has hepatitis B. ? You have had sex with someone who has hepatitis B. ? You get hemodialysis treatment. ? You take certain medicines for conditions, including cancer, organ transplantation, and autoimmune conditions. Hepatitis C  Blood testing is recommended for: ? Everyone born from [birth year] through 1967. ? Anyone with known risk factors for hepatitis C. Sexually transmitted infections (STIs)  You should be screened for sexually transmitted infections (STIs) including gonorrhea and chlamydia if: ? You are sexually active and are younger than 45 years of age. ? You are older than 45 years of age and your health care provider tells you that you are at risk for this type of infection. ? Your sexual activity has changed since you were last screened and you are at an increased risk for chlamydia or gonorrhea. Ask your health care provider if you are at risk.  If you do not have HIV, but are at risk, it may be recommended that you take a prescription medicine daily to prevent HIV infection. This is called pre-exposure prophylaxis (PrEP). You are considered at risk if: ? You are sexually active and do not regularly use condoms or know the HIV status of your partner(s). ? You take drugs by injection. ? You are  sexually active with a partner who has HIV. Talk with your health care provider about whether you are at high risk of being infected with HIV. If you choose to begin PrEP, you should first be tested for HIV. You should then be tested every 3 months for as long as you are taking PrEP. Pregnancy  If you are premenopausal and you may become pregnant, ask your health care provider about preconception counseling.  If you may become pregnant, take 400 to 800  micrograms (mcg) of folic acid every day.  If you want to prevent pregnancy, talk to your health care provider about birth control (contraception). Osteoporosis and menopause  Osteoporosis is a disease in which the bones lose minerals and strength with aging. This can result in serious bone fractures. Your risk for osteoporosis can be identified using a bone density scan.  If you are 70 years of age or older, or if you are at risk for osteoporosis and fractures, ask your health care provider if you should be screened.  Ask your health care provider whether you should take a calcium or vitamin D supplement to lower your risk for osteoporosis.  Menopause may have certain physical symptoms and risks.  Hormone replacement therapy may reduce some of these symptoms and risks. Talk to your health care provider about whether hormone replacement therapy is right for you. Follow these instructions at home:  Schedule regular health, dental, and eye exams.  Stay current with your immunizations.  Do not use any tobacco products including cigarettes, chewing tobacco, or electronic cigarettes.  If you are pregnant, do not drink alcohol.  If you are breastfeeding, limit how much and how often you drink alcohol.  Limit alcohol intake to no more than 1 drink per day for nonpregnant women. One drink equals 12 ounces of beer, 5 ounces of wine, or 1 ounces of hard liquor.  Do not use street drugs.  Do not share needles.  Ask your health care  provider for help if you need support or information about quitting drugs.  Tell your health care provider if you often feel depressed.  Tell your health care provider if you have ever been abused or do not feel safe at home. This information is not intended to replace advice given to you by your health care provider. Make sure you discuss any questions you have with your health care provider. Document Released: 08/23/2010 Document Revised: 07/16/2015 Document Reviewed: 11/11/2014 Elsevier Interactive Patient Education  2019 [employer]. Carbohydrate Counting for Diabetes Mellitus, Adult  Carbohydrate counting is a method of keeping track of how many carbohydrates you eat. Eating carbohydrates naturally increases the amount of sugar (glucose) in the blood. Counting how many carbohydrates you eat helps keep your blood glucose within normal limits, which helps you manage your diabetes (diabetes mellitus). It is important to know how many carbohydrates you can safely have in each meal. This is different for every person. A diet and nutrition specialist (registered dietitian) can help you make a meal plan and calculate how many carbohydrates you should have at each meal and snack. Carbohydrates are found in the following foods:  Grains, such as breads and cereals.  Dried beans and soy products.  Starchy vegetables, such as potatoes, peas, and corn.  Fruit and fruit juices.  Milk and yogurt.  Sweets and snack foods, such as cake, cookies, candy, chips, and soft drinks. How do I count carbohydrates? There are two ways to count carbohydrates in food. You can use either of the methods or a combination of both. Reading "Nutrition Facts" on packaged food The "Nutrition Facts" list is included on the labels of almost all packaged foods and beverages in the [place] It includes:  The serving size.  Information about nutrients in each serving, including the grams (g) of carbohydrate per serving. To  use the "Nutrition Facts":  Decide how many servings you will have.  Multiply the number of servings by the number of carbohydrates per serving.  The resulting number  is the total amount of carbohydrates that you will be having. Learning standard serving sizes of other foods When you eat carbohydrate foods that are not packaged or do not include "Nutrition Facts" on the label, you need to measure the servings in order to count the amount of carbohydrates:  Measure the foods that you will eat with a food scale or measuring cup, if needed.  Decide how many standard-size servings you will eat.  Multiply the number of servings by 15. Most carbohydrate-rich foods have about 15 g of carbohydrates per serving. ? For example, if you eat 8 oz (170 g) of strawberries, you will have eaten 2 servings and 30 g of carbohydrates (2 servings x 15 g = 30 g).  For foods that have more than one food mixed, such as soups and casseroles, you must count the carbohydrates in each food that is included. The following list contains standard serving sizes of common carbohydrate-rich foods. Each of these servings has about 15 g of carbohydrates:   hamburger bun or  English muffin.   oz (15 mL) syrup.   oz (14 g) jelly.  1 slice of bread.  1 six-inch tortilla.  3 oz (85 g) cooked rice or pasta.  4 oz (113 g) cooked dried beans.  4 oz (113 g) starchy vegetable, such as peas, corn, or potatoes.  4 oz (113 g) hot cereal.  4 oz (113 g) mashed potatoes or  of a large baked potato.  4 oz (113 g) canned or frozen fruit.  4 oz (120 mL) fruit juice.  4-6 crackers.  6 chicken nuggets.  6 oz (170 g) unsweetened dry cereal.  6 oz (170 g) plain fat-free yogurt or yogurt sweetened with artificial sweeteners.  8 oz (240 mL) milk.  8 oz (170 g) fresh fruit or one small piece of fruit.  24 oz (680 g) popped popcorn. Example of carbohydrate counting Sample meal  3 oz (85 g) chicken breast.  6 oz  (170 g) brown rice.  4 oz (113 g) corn.  8 oz (240 mL) milk.  8 oz (170 g) strawberries with sugar-free whipped topping. Carbohydrate calculation 1. Identify the foods that contain carbohydrates: ? Rice. ? Corn. ? Milk. ? Strawberries. 2. Calculate how many servings you have of each food: ? 2 servings rice. ? 1 serving corn. ? 1 serving milk. ? 1 serving strawberries. 3. Multiply each number of servings by 15 g: ? 2 servings rice x 15 g = 30 g. ? 1 serving corn x 15 g = 15 g. ? 1 serving milk x 15 g = 15 g. ? 1 serving strawberries x 15 g = 15 g. 4. Add together all of the amounts to find the total grams of carbohydrates eaten: ? 30 g + 15 g + 15 g + 15 g = 75 g of carbohydrates total. Summary  Carbohydrate counting is a method of keeping track of how many carbohydrates you eat.  Eating carbohydrates naturally increases the amount of sugar (glucose) in the blood.  Counting how many carbohydrates you eat helps keep your blood glucose within normal limits, which helps you manage your diabetes.  A diet and nutrition specialist (registered dietitian) can help you make a meal plan and calculate how many carbohydrates you should have at each meal and snack. This information is not intended to replace advice given to you by your health care provider. Make sure you discuss any questions you have with your health care provider. Document  Released: 02/07/2005 Document Revised: 08/17/2016 Document Reviewed: 07/22/2015 Elsevier Interactive Patient Education  2019 [employer] Why am I having this test? BRCA gene testing is done to check for the presence of harmful changes (mutations) in the BRCA1 gene or the BRCA2 gene (breast cancer susceptibility genes). If there is a mutation, the genes may not be able to help repair damaged cells in the body. As a result, the damaged cells may develop defects that can lead to certain types of cancer. You may have this test if you  have a family history of certain types of cancer, including cancer of the:  Breast.  Ovaries.  Fallopian tubes.  Peritoneum.  Pancreas.  Prostate. What kind of sample is taken?     The test requires either a sample of blood or a sample of cells from your saliva. If a sample of blood is needed, it will probably be collected by inserting a needle into a vein. If a sample of saliva is needed, you will get instructions about how to collect the sample. What do the results mean? The test results can show whether you have a mutation in the BRCA1 or BRCA2 gene that increases your risk for certain cancers. Meaning of negative test results A negative test result means that you do not have a mutation in the BRCA1 or BRCA2 gene that is known to increase your risk for certain cancers. This does not mean that you will never get cancer. Talk with your health care provider or a genetic counselor about what this result means for you. Meaning of positive test results A positive test result means that you have a mutation in the BRCA1 or BRCA2 gene that increases your risk for certain cancers. Women with a positive test result have an increased risk for breast and ovarian cancer. Both women and men with a mutation have an increased risk for breast cancer and may be at greater risk for other types of cancer. Getting a positive test result does not mean that you will develop cancer. Talk with your health care provider or a genetic counselor about what this result means for you. You may be told that you are a carrier. This means that you can pass the mutation to your children. Meaning of ambiguous test results Ambiguous, inconclusive, or uncertain test results mean that there is a change in the BRCA1 or BRCA2 gene, but it is a change that has not been linked to cancer. Talk with your health care provider or a genetic counselor about what this result means for you. Talk with your health care provider to discuss  your results, treatment options, and if necessary, the need for more tests. Talk with your health care provider if you have any questions about your results. How do I get my results? It is up to you to get your test results. Ask your health care provider, or the department that is doing the test, when your results will be ready. This information is not intended to replace advice given to you by your health care provider. Make sure you discuss any questions you have with your health care provider. Document Released: 03/03/2004 Document Revised: 09/04/2017 Document Reviewed: 09/30/2015 Elsevier Interactive Patient Education  2019 [employer].

## 2018-05-15 NOTE — Telephone Encounter (Signed)
-----  Message from Huel Cote, NP sent at 05/15/2018 11:53 AM EDT ----- Please schedule [name] an appointment with genetic counselor at [facility].  Mother died in May 03, 2010 from metastatic breast cancer, BRCA testing was not done on mother.  Patient states she can go any day that the appointment is scheduled.

## 2018-05-15 NOTE — Telephone Encounter (Signed)
Referral placed in epic they will call to schedule. 

## 2018-05-15 NOTE — Progress Notes (Signed)
[name] November 06, 1975 [id number]    History:    Presents for annual exam.  09/2014 Mirena IUD occasional light bleeding.  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  Back surgery 2015-05-11 and scheduled for a repeat surgery involving a fusion.  Currently on Workmen's Comp. from [employer].  Mother died from metastatic breast cancer in 05/11/2010 BRCA status unknown.  Same partner greater than 4 years.  Past medical history, past surgical history, family history and social history were all reviewed and documented in the EPIC chart.  [name] 15 , [name] 14 both children doing well.  ROS:  A ROS was performed and pertinent positives and negatives are included.  Exam:  Vitals:   05/15/18 1045  BP: 132/80  Weight: 201 lb (91.2 kg)  Height: '5\' 7"'$  (1.702 m)   Body mass index is 31.48 kg/m.   General appearance:  Normal Thyroid:  Symmetrical, normal in size, without palpable masses or nodularity. Respiratory  Auscultation:  Clear without wheezing or rhonchi Cardiovascular  Auscultation:  Regular rate, without rubs, murmurs or gallops  Edema/varicosities:  Not grossly evident Abdominal  Soft,nontender, without masses, guarding or rebound.  Liver/spleen:  No organomegaly noted  Hernia:  None appreciated  Skin  Inspection:  Grossly normal   Breasts: Examined lying and sitting.     Right: Without masses, retractions, discharge or axillary adenopathy.     Left: Without masses, retractions, discharge or axillary adenopathy. Gentitourinary   Inguinal/mons:  Normal without inguinal adenopathy  External genitalia:  Normal  BUS/Urethra/Skene's glands:  Normal  Vagina:  Normal  Cervix:  Normal IUD string visible  Uterus:   normal in size, shape and contour.  Midline and mobile  Adnexa/parametria:     Rt: Without masses or tenderness.   Lt: Without masses or tenderness.  Anus and perineum: Normal  Digital rectal exam: Normal sphincter tone without palpated masses or tenderness  Assessment/Plan:  43 y.o. D  WF G2, P2 for annual exam with no complaints.  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  SBEs, continue annual 3D screening mammogram, calcium rich foods, vitamin D 1000 daily encouraged.  Reviewed importance of increasing exercise and decreasing calorie/carbs.  CBC, CMP, lipid panel, Pap with HR HPV typing, new screening guidelines reviewed.   [place], 11:13 AM 05/15/2018

## 2018-05-16 LAB — CBC WITH DIFFERENTIAL/PLATELET
ABSOLUTE MONOCYTES: 600 {cells}/uL (ref 200–950)
BASOS PCT: 0.3 %
Basophils Absolute: 24 cells/uL (ref 0–200)
EOS PCT: 0.8 %
Eosinophils Absolute: 63 cells/uL (ref 15–500)
HCT: 42.5 % (ref 35.0–45.0)
Hemoglobin: 15.3 g/dL (ref 11.7–15.5)
Lymphs Abs: 1177 cells/uL (ref 850–3900)
MCH: 35.3 pg — ABNORMAL HIGH (ref 27.0–33.0)
MCHC: 36 g/dL (ref 32.0–36.0)
MCV: 97.9 fL (ref 80.0–100.0)
MPV: 9.2 fL (ref 7.5–12.5)
Monocytes Relative: 7.6 %
NEUTROS PCT: 76.4 %
Neutro Abs: 6036 cells/uL (ref 1500–7800)
PLATELETS: 264 10*3/uL (ref 140–400)
RBC: 4.34 10*6/uL (ref 3.80–5.10)
RDW: 11.8 % (ref 11.0–15.0)
TOTAL LYMPHOCYTE: 14.9 %
WBC: 7.9 10*3/uL (ref 3.8–10.8)

## 2018-05-16 LAB — COMPREHENSIVE METABOLIC PANEL
AG Ratio: 1.8 (calc) (ref 1.0–2.5)
ALKALINE PHOSPHATASE (APISO): 113 U/L (ref 31–125)
ALT: 32 U/L — ABNORMAL HIGH (ref 6–29)
AST: 34 U/L — ABNORMAL HIGH (ref 10–30)
Albumin: 4.8 g/dL (ref 3.6–5.1)
BILIRUBIN TOTAL: 1.1 mg/dL (ref 0.2–1.2)
BUN: 11 mg/dL (ref 7–25)
CO2: 25 mmol/L (ref 20–32)
Calcium: 9.6 mg/dL (ref 8.6–10.2)
Chloride: 99 mmol/L (ref 98–110)
Creat: 1.01 mg/dL (ref 0.50–1.10)
GLOBULIN: 2.6 g/dL (ref 1.9–3.7)
Glucose, Bld: 97 mg/dL (ref 65–99)
POTASSIUM: 4.7 mmol/L (ref 3.5–5.3)
Sodium: 136 mmol/L (ref 135–146)
Total Protein: 7.4 g/dL (ref 6.1–8.1)

## 2018-05-16 LAB — PAP, TP IMAGING W/ HPV RNA, RFLX HPV TYPE 16,18/45: HPV DNA High Risk: NOT DETECTED

## 2018-05-16 LAB — LIPID PANEL
CHOLESTEROL: 237 mg/dL — AB (ref <200)
HDL: 88 mg/dL (ref >=50)
LDL Cholesterol (Calc): 130 mg/dL (calc) — ABNORMAL HIGH
Non-HDL Cholesterol (Calc): 149 mg/dL (calc) — ABNORMAL HIGH (ref <130)
Total CHOL/HDL Ratio: 2.7 (calc) (ref <5.0)
Triglycerides: 86 mg/dL (ref <150)

## 2018-05-18 ENCOUNTER — Encounter: Payer: Self-pay | Admitting: Genetic Counselor

## 2018-05-18 ENCOUNTER — Telehealth: Payer: Self-pay | Admitting: Genetic Counselor

## 2018-05-18 NOTE — Telephone Encounter (Signed)
A genetic counseling appt has been scheduled for the pt to see Maylon Cos on 6/10 at 1pm. Letter mailed.

## 2018-05-23 NOTE — Telephone Encounter (Signed)
Appointment on 08/01/18

## 2018-07-31 ENCOUNTER — Telehealth: Payer: Self-pay | Admitting: Genetic Counselor

## 2018-07-31 NOTE — Telephone Encounter (Signed)
Patient returned phone call regarding voicemail that was left, patient will like this to be a walk-in visit.

## 2018-07-31 NOTE — Telephone Encounter (Signed)
Called patient regarding upcoming [facility] appointment, left patient a voicemail. This will be a walk-in visit due to no communication to set up virtual visit.

## 2018-08-01 ENCOUNTER — Other Ambulatory Visit: Payer: Self-pay

## 2018-08-01 ENCOUNTER — Inpatient Hospital Stay: Payer: BC Managed Care – PPO

## 2018-08-01 ENCOUNTER — Other Ambulatory Visit: Payer: Self-pay | Admitting: Genetic Counselor

## 2018-08-01 ENCOUNTER — Encounter: Payer: Self-pay | Admitting: Genetic Counselor

## 2018-08-01 ENCOUNTER — Inpatient Hospital Stay: Payer: BC Managed Care – PPO | Attending: Genetic Counselor | Admitting: Genetic Counselor

## 2018-08-01 DIAGNOSIS — Z803 Family history of malignant neoplasm of breast: Secondary | ICD-10-CM

## 2018-08-01 DIAGNOSIS — Z808 Family history of malignant neoplasm of other organs or systems: Secondary | ICD-10-CM | POA: Insufficient documentation

## 2018-08-01 DIAGNOSIS — Z1379 Encounter for other screening for genetic and chromosomal anomalies: Secondary | ICD-10-CM

## 2018-08-01 NOTE — Progress Notes (Signed)
REFERRING PROVIDER: Huel Cote, NP [place], [place] [address]  PRIMARY PROVIDER:  Patient, No Pcp Per  PRIMARY REASON FOR VISIT:  1. Family history of breast cancer   2. Family history of melanoma      HISTORY OF PRESENT ILLNESS:   Ms. Neace, a 43 y.o. female, was seen for a [place] cancer genetics consultation at the request of Dr. Annamaria Boots due to a family history of cancer.  Ms. Fifer presents to clinic today to discuss the possibility of a hereditary predisposition to cancer, genetic testing, and to further clarify her future cancer risks, as well as potential cancer risks for family members.   Ms. Sanjurjo is a 43 y.o. female with no personal history of cancer.    CANCER HISTORY:   No history exists.     RISK FACTORS:  Menarche was at age 58.  First live birth at age 89.  OCP use for approximately 0 years.  Ovaries intact: yes.  Hysterectomy: no.  Menopausal status: perimenopausal.  HRT use: 0 years. Colonoscopy: no; not examined. Mammogram within the last year: yes. Number of breast biopsies: 1. Up to date with pelvic exams: yes. Any excessive radiation exposure in the past: no  Past Medical History:  Diagnosis Date  . Family history of breast cancer   . Family history of melanoma     Past Surgical History:  Procedure Laterality Date  . BACK SURGERY      Social History   Socioeconomic History  . Marital status: Married    Spouse name: Not on file  . Number of children: Not on file  . Years of education: Not on file  . Highest education level: Not on file  Occupational History  . Not on file  Social Needs  . Financial resource strain: Not on file  . Food insecurity:    Worry: Not on file    Inability: Not on file  . Transportation needs:    Medical: Not on file    Non-medical: Not on file  Tobacco Use  . Smoking status: Never Smoker  . Smokeless tobacco: Never Used  Substance and Sexual Activity  . Alcohol use: Yes   . Drug use: No  . Sexual activity: Yes    Birth control/protection: None    Comment: VASTCOMY  Lifestyle  . Physical activity:    Days per week: Not on file    Minutes per session: Not on file  . Stress: Not on file  Relationships  . Social connections:    Talks on phone: Not on file    Gets together: Not on file    Attends religious service: Not on file    Active member of club or organization: Not on file    Attends meetings of clubs or organizations: Not on file    Relationship status: Not on file  Other Topics Concern  . Not on file  Social History Narrative  . Not on file     FAMILY HISTORY:  We obtained a detailed, 4-generation family history.  Significant diagnoses are listed below: Family History  Problem Relation Age of Onset  . Breast cancer Mother 57  . Melanoma Father        dx mid 64s  . Aneurysm Paternal Grandmother   . Heart attack Paternal Grandfather   . Lung cancer Paternal Uncle     The patient has a son and daughter who are cancer free.  She has two sisters who  are cancer free.  Both parents are deceased.  The patient's mother had breast cancer at 77.  She had three brothers and a sister, none had cancer.  The maternal grandparents are deceased.  The patient's father had melanoma in his 19's.  He had three sisters and four brothers.  One brother had lung cancer.  The paternal grandparents are deceased.  Ms. Biddy is unaware of previous family history of genetic testing for hereditary cancer risks. Patient's maternal ancestors are of [place] descent, and paternal ancestors are of [place] descent. There is no reported Ashkenazi Jewish ancestry. There is no known consanguinity.  GENETIC COUNSELING ASSESSMENT: Ms. Bernales is a 43 y.o. female with a family history of cancer which is somewhat suggestive of a hereditary cancer syndrome and predisposition to cancer. We, therefore, discussed and recommended the following at today's visit.   DISCUSSION: We  discussed that 5 - 10% of breast cancer is hereditary, with most cases associated with BRCA mutations.  There are other genes that can be associated with hereditary breast cancer syndromes.  These include ATM, CHEK2 and PALB2.  Based on her family history of one young individual diagnosed with breast cancer she meets the criteria for genetic testing.  Her father had melanoma in his 7's.  We discussed that some hereditary syndromes are associated with melanoma, and we will add on the genes associated with hereditary melanoma syndromes. We discussed that testing is beneficial for several reasons including knowing how to follow individuals after completing their treatment, identifying whether potential treatment options such as PARP inhibitors would be beneficial, and understand if other family members could be at risk for cancer and allow them to undergo genetic testing.   We reviewed the characteristics, features and inheritance patterns of hereditary cancer syndromes. We also discussed genetic testing, including the appropriate family members to test, the process of testing, insurance coverage and turn-around-time for results. We discussed the implications of a negative, positive and/or variant of uncertain significant result. We recommended Ms. Mahurin pursue genetic testing for the multi cancer gene panel. The Multi-Gene Panel offered by [facility] includes sequencing and/or deletion duplication testing of the following 85 genes: AIP, ALK, APC, ATM, AXIN2,BAP1,  BARD1, BLM, BMPR1A, BRCA1, BRCA2, BRIP1, CASR, CDC73, CDH1, CDK4, CDKN1B, CDKN1C, CDKN2A (p14ARF), CDKN2A (p16INK4a), CEBPA, CHEK2, CTNNA1, DICER1, DIS3L2, EGFR (c.2369C>T, p.Thr790Met variant only), EPCAM (Deletion/duplication testing only), FH, FLCN, GATA2, GPC3, GREM1 (Promoter region deletion/duplication testing only), HOXB13 (c.251G>A, p.Gly84Glu), HRAS, KIT, MAX, MEN1, MET, MITF (c.952G>A, p.Glu318Lys variant only), MLH1, MSH2, MSH3, MSH6, MUTYH, NBN,  NF1, NF2, NTHL1, PALB2, PDGFRA, PHOX2B, PMS2, POLD1, POLE, POT1, PRKAR1A, PTCH1, PTEN, RAD50, RAD51C, RAD51D, RB1, RECQL4, RET, RNF43, RUNX1, SDHAF2, SDHA (sequence changes only), SDHB, SDHC, SDHD, SMAD4, SMARCA4, SMARCB1, SMARCE1, STK11, SUFU, TERC, TERT, TMEM127, TP53, TSC1, TSC2, VHL, WRN and WT1.    Based on Ms. Mataya's personal and family history of cancer, she meets medical criteria for genetic testing. Despite that she meets criteria, she may still have an out of pocket cost. We discussed that if her out of pocket cost for testing is over $100, the laboratory will call and confirm whether she wants to proceed with testing.  If the out of pocket cost of testing is less than $100 she will be billed by the genetic testing laboratory.   PLAN: After considering the risks, benefits, and limitations, Ms. Scharrer provided informed consent to pursue genetic testing and the blood sample was sent to [facility] for analysis of the Multi cancer panel. Results  should be available within approximately 2-3 weeks' time, at which point they will be disclosed by telephone to Ms. Friberg, as will any additional recommendations warranted by these results. Ms. Mcdougle will receive a summary of her genetic counseling visit and a copy of her results once available. This information will also be available in Epic.   Lastly, we encouraged Ms. Cotham to remain in contact with cancer genetics annually so that we can continuously update the family history and inform her of any changes in cancer genetics and testing that may be of benefit for this family.   Ms. Khiev questions were answered to her satisfaction today. Our contact information was provided should additional questions or concerns arise. Thank you for the referral and allowing [place] to share in the care of your patient.   Media P. Florene Glen, [place], [facility] Certified Genetic Counselor [email or web address] phone: [phone]  The patient was seen for a total of 45  minutes in face-to-face genetic counseling.  This patient was discussed with Drs. Magrinat, Lindi Adie and/or Burr Medico who agrees with the above.    _______________________________________________________________________ For Office Staff:  Number of people involved in session: 1 Was an Intern/ student involved with case: no

## 2018-08-17 ENCOUNTER — Ambulatory Visit: Payer: Self-pay | Admitting: Genetic Counselor

## 2018-08-17 ENCOUNTER — Telehealth: Payer: Self-pay | Admitting: Genetic Counselor

## 2018-08-17 ENCOUNTER — Encounter: Payer: Self-pay | Admitting: Genetic Counselor

## 2018-08-17 DIAGNOSIS — Z1379 Encounter for other screening for genetic and chromosomal anomalies: Secondary | ICD-10-CM

## 2018-08-17 NOTE — Telephone Encounter (Signed)
Revealed negative genetic testing.  Discussed that we do not know why there is cancer in the family. It could be due to a different gene that we are not testing, or maybe our current technology may not be able to pick something up.  It will be important for her to keep in contact with genetics to keep up with whether additional testing may be needed. Revealed CASR VUS.

## 2018-08-17 NOTE — Progress Notes (Signed)
HPI:  [name] was previously seen in the [facility] clinic due to a family history of breast cancer and concerns regarding a hereditary predisposition to cancer. Please refer to our prior cancer genetics clinic note for more information regarding our discussion, assessment and recommendations, at the time. [name] recent genetic test results were disclosed to her, as were recommendations warranted by these results. These results and recommendations are discussed in more detail below.  CANCER HISTORY:  Oncology History   No history exists.    FAMILY HISTORY:  We obtained a detailed, 4-generation family history.  Significant diagnoses are listed below: Family History  Problem Relation Age of Onset   Breast cancer Mother 9   Melanoma Father        dx mid 41s   Aneurysm Paternal Grandmother    Heart attack Paternal Grandfather    Lung cancer Paternal Uncle     The patient has a son and daughter who are cancer free.  She has two sisters who are cancer free.  Both parents are deceased.  The patient's mother had breast cancer at 17.  She had three brothers and a sister, none had cancer.  The maternal grandparents are deceased.  The patient's father had melanoma in his 72's.  He had three sisters and four brothers.  One brother had lung cancer.  The paternal grandparents are deceased.  [name] is unaware of previous family history of genetic testing for hereditary cancer risks. Patient's maternal ancestors are of [place] descent, and paternal ancestors are of [place] descent. There is no reported Ashkenazi Jewish ancestry. There is no known consanguinity.    GENETIC TEST RESULTS: Genetic testing reported out on August 15, 2018 through the multi-cancer panel found no pathogenic mutations. The Multi-Gene Panel offered by [facility] includes sequencing and/or deletion duplication testing of the following 85 genes: AIP, ALK, APC, ATM, AXIN2,BAP1,  BARD1, BLM, BMPR1A, BRCA1,  BRCA2, BRIP1, CASR, CDC73, CDH1, CDK4, CDKN1B, CDKN1C, CDKN2A (p14ARF), CDKN2A (p16INK4a), CEBPA, CHEK2, CTNNA1, DICER1, DIS3L2, EGFR (c.2369C>T, p.Thr790Met variant only), EPCAM (Deletion/duplication testing only), FH, FLCN, GATA2, GPC3, GREM1 (Promoter region deletion/duplication testing only), HOXB13 (c.251G>A, p.Gly84Glu), HRAS, KIT, MAX, MEN1, MET, MITF (c.952G>A, p.Glu318Lys variant only), MLH1, MSH2, MSH3, MSH6, MUTYH, NBN, NF1, NF2, NTHL1, PALB2, PDGFRA, PHOX2B, PMS2, POLD1, POLE, POT1, PRKAR1A, PTCH1, PTEN, RAD50, RAD51C, RAD51D, RB1, RECQL4, RET, RNF43, RUNX1, SDHAF2, SDHA (sequence changes only), SDHB, SDHC, SDHD, SMAD4, SMARCA4, SMARCB1, SMARCE1, STK11, SUFU, TERC, TERT, TMEM127, TP53, TSC1, TSC2, VHL, WRN and WT1.  The test report has been scanned into EPIC and is located under the Molecular Pathology section of the Results Review tab.  A portion of the result report is included below for reference.     We discussed with [name] that because current genetic testing is not perfect, it is possible there may be a gene mutation in one of these genes that current testing cannot detect, but that chance is small.  We also discussed, that there could be another gene that has not yet been discovered, or that we have not yet tested, that is responsible for the cancer diagnoses in the family. It is also possible there is a hereditary cause for the cancer in the family that [name] did not inherit and therefore was not identified in her testing.  Therefore, it is important to remain in touch with cancer genetics in the future so that we can continue to offer [name] the most up to date genetic testing.  Genetic testing did identify a variant of uncertain significance (VUS) was identified in the CASR gene called c.1193A>G.  At this time, it is unknown if this variant is associated with increased cancer risk or if this is a normal finding, but most variants such as this get reclassified to being  inconsequential. It should not be used to make medical management decisions. With time, we suspect the lab will determine the significance of this variant, if any. If we do learn more about it, we will try to contact [name] to discuss it further. However, it is important to stay in touch with [place] periodically and keep the address and phone number up to date.  ADDITIONAL GENETIC TESTING: We discussed with [name] that there are other genes that are associated with increased cancer risk that can be analyzed. Should [name] wish to pursue additional genetic testing, we are happy to discuss and coordinate this testing, at any time.    CANCER SCREENING RECOMMENDATIONS: [name] test result is considered negative (normal).  This means that we have not identified a hereditary cause for her personal and family history of cancer at this time. Most cancers happen by chance and this negative test suggests that her cancer may fall into this category.    While reassuring, this does not definitively rule out a hereditary predisposition to cancer. It is still possible that there could be genetic mutations that are undetectable by current technology. There could be genetic mutations in genes that have not been tested or identified to increase cancer risk.  Therefore, it is recommended she continue to follow the cancer management and screening guidelines provided by her primary healthcare provider.   An individual's cancer risk and medical management are not determined by genetic test results alone. Overall cancer risk assessment incorporates additional factors, including personal medical history, family history, and any available genetic information that may result in a personalized plan for cancer prevention and surveillance  RECOMMENDATIONS FOR FAMILY MEMBERS:  Individuals in this family might be at some increased risk of developing cancer, over the general population risk, simply due to the family history of  cancer.  We recommended women in this family have a yearly mammogram beginning at age 48, or 46 years younger than the earliest onset of cancer, an annual clinical breast exam, and perform monthly breast self-exams. Women in this family should also have a gynecological exam as recommended by their primary provider. All family members should have a colonoscopy by age 71.  It is also possible there is a hereditary cause for the cancer in [name]'s family that she did not inherit and therefore was not identified in her.  Based on [name]'s family history, we recommended her sisters have genetic counseling and testing. [name] will let us know if we can be of any assistance in coordinating genetic counseling and/or testing for this family member.   FOLLOW-UP: Lastly, we discussed with [name] that cancer genetics is a rapidly advancing field and it is possible that new genetic tests will be appropriate for her and/or her family members in the future. We encouraged her to remain in contact with cancer genetics on an annual basis so we can update her personal and family histories and let her know of advances in cancer genetics that may benefit this family.   Our contact number was provided. [name] questions were answered to her satisfaction, and she knows she is welcome to call us at anytime with additional questions  or concerns.   Roma Kayser, MS, [facility] Certified Genetic Counselor [email or web address]

## 2018-08-20 ENCOUNTER — Telehealth: Payer: Self-pay | Admitting: Women's Health

## 2018-08-20 NOTE — Telephone Encounter (Signed)
-----   Message from Clarene Essex, Counselor sent at 08/17/2018  2:07 PM EDT ----- VUS but otherwise normal testing

## 2018-10-02 ENCOUNTER — Other Ambulatory Visit: Payer: Self-pay | Admitting: Women's Health

## 2018-10-02 DIAGNOSIS — Z1231 Encounter for screening mammogram for malignant neoplasm of breast: Secondary | ICD-10-CM

## 2018-11-14 ENCOUNTER — Ambulatory Visit
Admission: RE | Admit: 2018-11-14 | Discharge: 2018-11-14 | Disposition: A | Discharge status: Home / Self Care | Payer: BC Managed Care – PPO | Source: Ambulatory Visit | Attending: Women's Health | Admitting: Women's Health

## 2018-11-14 ENCOUNTER — Other Ambulatory Visit: Payer: Self-pay

## 2018-11-14 DIAGNOSIS — Z1231 Encounter for screening mammogram for malignant neoplasm of breast: Secondary | ICD-10-CM

## 2019-03-27 ENCOUNTER — Telehealth: Payer: Self-pay | Admitting: *Deleted

## 2019-03-27 NOTE — Telephone Encounter (Signed)
Per Medical records there is no blood typing labs in Epic system. Will request pt's chart. Pt will fill out Medical release form as well. Will call patient once we have information she is requesting

## 2019-03-27 NOTE — Telephone Encounter (Signed)
Patient called requesting her blood type as well as her daughter Gladys Damme. I explained that paper charts are at a off site location and they will need to be requested and I will forward so this process can start. Patient verbalized she understood. I did tell her I am not sure how long this process with take as well.

## 2019-03-27 NOTE — Telephone Encounter (Signed)
-----   Message from Aura Camps, [place] sent at 03/27/2019  1:19 PM EST ----- Elvina Sidle this patient and her daughter Harman Ferrin 05/30/2000 both would like to know their blood type. I did explained that the records are at a office site location and they will need to be requested. I told her I am not aware of how the process works, but I would relay.  Thanks

## 2019-04-02 ENCOUNTER — Ambulatory Visit: Payer: Self-pay | Admitting: Orthopedic Surgery

## 2019-04-03 ENCOUNTER — Other Ambulatory Visit: Payer: Self-pay

## 2019-04-05 ENCOUNTER — Other Ambulatory Visit: Payer: Self-pay

## 2019-04-08 ENCOUNTER — Telehealth (HOSPITAL_COMMUNITY): Payer: Self-pay

## 2019-04-08 NOTE — Telephone Encounter (Signed)
Found pt information she was requesting  Blood type O+.

## 2019-04-08 NOTE — Telephone Encounter (Signed)

## 2019-04-09 ENCOUNTER — Ambulatory Visit (INDEPENDENT_AMBULATORY_CARE_PROVIDER_SITE_OTHER): Payer: No Typology Code available for payment source | Admitting: Vascular Surgery

## 2019-04-09 ENCOUNTER — Other Ambulatory Visit: Payer: Self-pay

## 2019-04-09 ENCOUNTER — Encounter: Payer: Self-pay | Admitting: Vascular Surgery

## 2019-04-09 VITALS — BP 118/70 | HR 82 | Temp 97.9°F | Resp 20 | Ht 67.0 in | Wt 210.0 lb

## 2019-04-09 DIAGNOSIS — M5136 Other intervertebral disc degeneration, lumbar region: Secondary | ICD-10-CM | POA: Diagnosis not present

## 2019-04-09 NOTE — Progress Notes (Signed)
[facility] [place]  Patient name: [name] MRN: [id number] DOB: 1975-12-26 Sex: female  REASON FOR CONSULT: Discuss anterior exposure for L4-5 interbody fusion with Dr. Shon Baton  HPI: [name] is a 44 y.o. female, who is here today for discussion of anterior exposure for L4-5 fusion.  She has had a prior history of 4 5 laminectomy.  She says that she had initially good symptom relief but then fell again at work and has had recurrent symptoms.  She has severe pain in her back extending down into her left leg but now is also affecting her right leg.  She is failed conservative treatment.  She did undergo what sounds like a diagnostic laparoscopy for fertility issues but has had no other intra-abdominal surgery.  He has no history of cardiac disease  Past Medical History:  Diagnosis Date  . Acute low back pain   . Degeneration of lumbar intervertebral disc   . Family history of breast cancer   . Family history of melanoma   . History of lumbar discectomy   . Lumbar post-laminectomy syndrome     Family History  Problem Relation Age of Onset  . Breast cancer Mother 71  . Melanoma Father        dx mid 36s  . Aneurysm Paternal Grandmother   . Heart attack Paternal Grandfather   . Lung cancer Paternal Uncle     SOCIAL HISTORY: Social History   Socioeconomic History  . Marital status: Married    Spouse name: Not on file  . Number of children: Not on file  . Years of education: Not on file  . Highest education level: Not on file  Occupational History  . Not on file  Tobacco Use  . Smoking status: Never Smoker  . Smokeless tobacco: Never Used  Substance and Sexual Activity  . Alcohol use: Yes  . Drug use: No  . Sexual activity: Yes    Birth control/protection: None    Comment: VASTCOMY  Other Topics Concern  . Not on file  Social History Narrative  . Not on file   Social Determinants of Health    Financial Resource Strain:   . Difficulty of Paying Living Expenses: Not on file  Food Insecurity:   . Worried About Programme researcher, broadcasting/film/video in the Last Year: Not on file  . Ran Out of Food in the Last Year: Not on file  Transportation Needs:   . Lack of Transportation (Medical): Not on file  . Lack of Transportation (Non-Medical): Not on file  Physical Activity:   . Days of Exercise per Week: Not on file  . Minutes of Exercise per Session: Not on file  Stress:   . Feeling of Stress : Not on file  Social Connections:   . Frequency of Communication with Friends and Family: Not on file  . Frequency of Social Gatherings with Friends and Family: Not on file  . Attends Religious Services: Not on file  . Active Member of Clubs or Organizations: Not on file  . Attends Banker Meetings: Not on file  . Marital Status: Not on file  Intimate Partner Violence:   . Fear of Current or Ex-Partner: Not on file  . Emotionally Abused: Not on file  . Physically Abused: Not on file  . Sexually Abused: Not on file    No Known Allergies  Current Outpatient Medications  Medication Sig Dispense Refill  . fluticasone (FLONASE) 50 MCG/ACT  nasal spray 1 spray by Each Nare route Two (2) times a day.    . gabapentin (NEURONTIN) 300 MG capsule gabapentin 300 mg capsule  Take 1 capsule every day by oral route at bedtime for 30 days.    Marland Kitchen ibuprofen (ADVIL) 800 MG tablet Take by mouth.    . levonorgestrel (MIRENA) 20 MCG/24HR IUD 1 each by Intrauterine route once.    . Naproxen Sodium (ALEVE) 220 MG CAPS Aleve 220 mg capsule  Take by oral route as needed.     No current facility-administered medications for this visit.    REVIEW OF SYSTEMS:  [X]  denotes positive finding, [ ]  denotes negative finding Cardiac  Comments:  Chest pain or chest pressure:    Shortness of breath upon exertion:    Short of breath when lying flat:    Irregular heart rhythm:        Vascular    Pain in calf,  thigh, or hip brought on by ambulation:    Pain in feet at night that wakes you up from your sleep:     Blood clot in your veins:    Leg swelling:         Pulmonary    Oxygen at home:    Productive cough:     Wheezing:         Neurologic    Sudden weakness in arms or legs:     Sudden numbness in arms or legs:     Sudden onset of difficulty speaking or slurred speech:    Temporary loss of vision in one eye:     Problems with dizziness:         Gastrointestinal    Blood in stool:     Vomited blood:         Genitourinary    Burning when urinating:     Blood in urine:        Psychiatric    Major depression:         Hematologic    Bleeding problems:    Problems with blood clotting too easily:        Skin    Rashes or ulcers:        Constitutional    Fever or chills:      PHYSICAL EXAM: Vitals:   04/09/19 1103  BP: 118/70  Pulse: 82  Resp: 20  Temp: 97.9 F (36.6 C)  SpO2: 97%  Weight: 210 lb (95.3 kg)  Height: 5\' 7"  (1.702 m)    GENERAL: The patient is a well-nourished female, in no acute distress. The vital signs are documented above. CARDIOVASCULAR: Carotid arteries without bruits bilaterally.  2+ radial pulses bilaterally 2+ dorsalis pedis pulses bilaterally PULMONARY: There is good air exchange  ABDOMEN: Soft and non-tender  MUSCULOSKELETAL: There are no major deformities or cyanosis. NEUROLOGIC: No focal weakness or paresthesias are detected. SKIN: There are no ulcers or rashes noted. PSYCHIATRIC: The patient has a normal affect.  DATA:  I have plain films of her spine and also CT lumbar films from 2020.  This shows normal location of her great vessel bifurcation and no evidence of calcification of her aorta iliac arteries.  MEDICAL ISSUES: Discussion with the patient regarding my role for exposure.  Explained that Dr. Rolena Infante is determined that surgery is indicated and anterior approach is most optimal.  Discussed mobilization of the rectus muscle,  intra-abdominal contents, left ureter, arterial venous structures overlying the spine.  Discussed the potential injury for all these  in particular the risk for injury to venous structures.  She understands and wished to proceed as scheduled on May 08, 2019   Larina Earthly, MD [facility] Tel [phone] Pager [phone]

## 2019-04-29 ENCOUNTER — Ambulatory Visit: Payer: Self-pay | Admitting: Orthopedic Surgery

## 2019-04-29 NOTE — H&P (Signed)
Subjective:   [name] is a very pleasant Otherwise healthy 44 year old woman who has had a previous lumbar decompression discectomy as a result of the work injury with ongoing severe debilitating back pain and now bilateral radicular leg pain. She continues to have severe, debilitating pain that is altering her quality-of-life despite over-the-counter medications and an injection. Therefore, she would like to move forward with surgical intervention. She is scheduled for ALIF L4-5 on 05/08/19 With Dr. Shon Baton at [facility].  Patient Active Problem List   Diagnosis Date Noted  . Genetic testing 08/17/2018  . Family history of breast cancer   . Family history of melanoma   . Family history of breast cancer in mother 05/15/2018  . Mastodynia of right breast 01/20/2016  . IUD (intrauterine device) in place 10/07/2014  . CIN II (cervical intraepithelial neoplasia II) 11/14/2013   Past Medical History:  Diagnosis Date  . Acute low back pain   . Degeneration of lumbar intervertebral disc   . Family history of breast cancer   . Family history of melanoma   . History of lumbar discectomy   . Lumbar post-laminectomy syndrome     Past Surgical History:  Procedure Laterality Date  . AUGMENTATION MAMMAPLASTY    . BACK SURGERY      Current Outpatient Medications  Medication Sig Dispense Refill Last Dose  . fluticasone (FLONASE) 50 MCG/ACT nasal spray 1 spray by Each Nare route Two (2) times a day.     . gabapentin (NEURONTIN) 300 MG capsule gabapentin 300 mg capsule  Take 1 capsule every day by oral route at bedtime for 30 days.     Marland Kitchen ibuprofen (ADVIL) 800 MG tablet Take by mouth.     . levonorgestrel (MIRENA) 20 MCG/24HR IUD 1 each by Intrauterine route once.     . Naproxen Sodium (ALEVE) 220 MG CAPS Aleve 220 mg capsule  Take by oral route as needed.      No current facility-administered medications for this visit.   No Known Allergies  Social History   Tobacco Use  . Smoking  status: Never Smoker  . Smokeless tobacco: Never Used  Substance Use Topics  . Alcohol use: Yes    Family History  Problem Relation Age of Onset  . Breast cancer Mother 25  . Melanoma Father        dx mid 39s  . Aneurysm Paternal Grandmother   . Heart attack Paternal Grandfather   . Lung cancer Paternal Uncle     Review of Systems As stated in HPI  Objective:   Vitals: Ht: 5 ft 9 in 04/29/2019 01:24 pm Wt: 180 lbs 04/29/2019 01:31 pm BMI: 26.6 04/29/2019 01:31 pm BP: 140/80 sitting L arm 04/29/2019 02:08 pm Pulse: 82 bpm 04/29/2019 01:32 pm T: 98.3 F 04/29/2019 01:33 pm  Clinical exam: Alert and oriented 3, no apparent distress.  Inspection: No obvious deformity.  Heart: Regular rate and rhythm, no rubs, murmurs, or gallops  Lungs: Clear auscultation bilaterally  Abdomen: Bowel sounds 4, Non-distended, nontender, No rebound tenderness, no loss of bladder or bowel control.  Neuro: 5/5 motor strength in the lower extremity bilaterally. Positive dysesthesias and intermittent neuropathic leg pain. Significant back pain especially with forward flexion and rotation.  Range of motion: No significant hip, knee, ankle pain with isolated joint range of motion  CT discogram: completed on 04/18/18 was reviewed with the patient. I have also reviewed the radiology report. Patient had some contrast extension into the foraminal region at L3-4  but no radicular leg pain and no concordant back pain. L4-5 opening pressure was 30 but the patient felt concordant back pain with some familiar radiation into the lower extremity. The L5-S1 is a rudimentary disc and could not be accessed.  Lumbar MRI: completed on 01/29/19 was reviewed with the patient. I have also reviewed the radiology report. Patient has transitional anatomy and so care was taken to ensure that we maintained proper numbering. No significant pathology in the upper lumbar spine. Previous left L4-5 laminotomy. Positive degenerative  lumbar disc disease at L4-5 with prominent facet arthropathy. There is left L5 nerve root compression in the lateral recess. There is also a right foraminal stenosis. Sacralization of the L5-S1 level with decreased caliber of the disc. No canal or foraminal stenosis. This is essentially unchanged from her prior MRI.  Assessment:    [name] is a very pleasant 44 year old woman who has had a previous lumbar decompression discectomy as a result of the work injury. The patient now presents with ongoing severe debilitating back pain and now bilateral radicular leg pain. Repeat MRI confirms no significant change from prior imaging studies. At this point I do believe moving forward with a lumbar fusion is in her best interest. I have gone over the surgery in great detail including the risks and benefits and alternatives. All of her questions were encouraged and addressed.  Plan:   L4-5 anterior lumbar interbody fusion with exposure by Dr. Donnetta Hutching on 05/08/2019.  Risks and benefits of surgery were discussed with the patient. These include: Infection, bleeding, death, stroke, paralysis, ongoing or worse pain, need for additional surgery, nonunion, leak of spinal fluid, adjacent segment degeneration requiring additional fusion surgery, need for posterior decompression and/or fusion. Bleeding from major vessels, and blood clots (deep venous thrombosis)requiring additional treatment, loss in bowel and bladder control, injury to bladder, ureters, and other major abdominal organs.  We will obtain preoperative clearance from the patient's primary care provider.  Patient has been seen and examined by Dr. Donnetta Hutching and is a candidate for anterior exposure from his standpoint.  I reviewed the patient's medication list with her. She is not on any blood thinners. She is taking Aleve which I have advised her to stop 7 days before surgery. I also instructed her not to take any over-the-counter anti-inflammatory medications. She  may take Tylenol and her gabapentin as needed for pain.  We have also discussed the post-operative recovery period to include: bathing/showering restrictions, wound healing, activity (and driving) restrictions, medications/pain mangement.  We have also discussed post-operative redflags to include: signs and symptoms of postoperative infection, DVT/PE.  Patient has her appointment with physical therapy this afternoon to get fitted for her LSO brace.  Patient has her appointment at [facility] for preoperative testing scheduled for Monday.  All the patient's questions were invited and answered.  No change the patient's work status. She will be out of work From Date of surgery for a minimum of 6 weeks postoperatively at which point we will reevaluate her work restrictions.  Follow-up: 2 weeks postoperatively

## 2019-05-03 NOTE — Progress Notes (Signed)
Your procedure is scheduled on Wednesday May 08, 2019.  Report to [facility] Main Entrance "A" at 06:30 A.M., and check in at the Admitting office.  Call this number if you have problems the morning of surgery:  [phone]  Call [phone] if you have any questions prior to your surgery date Monday-Friday 8am-4pm   Remember: Do not eat or drink after midnight the night before your surgery   Take these medicines IF NEEDED the morning of surgery: fluticasone (FLONASE)   As of today, STOP taking any Aspirin (unless otherwise instructed by your surgeon), Aleve, Naproxen, Ibuprofen, Motrin, Advil, Goody's, BC's, all herbal medications, fish oil, and all vitamins.    The Morning of Surgery  Do not wear jewelry, make-up or nail polish.  Do not wear lotions, powders, perfumes, or deodorant  Do not shave 48 hours prior to surgery.    Do not bring valuables to the hospital.  [facility] is not responsible for any belongings or valuables.  If you are a smoker, DO NOT Smoke 24 hours prior to surgery  If you wear a CPAP at night please bring your mask the morning of surgery   Remember that you must have someone to transport you home after your surgery, and remain with you for 24 hours if you are discharged the same day.   Please bring cases for contacts, glasses, hearing aids, dentures or bridgework because it cannot be worn into surgery.    Leave your suitcase in the car.  After surgery it may be brought to your room.  For patients admitted to the hospital, discharge time will be determined by your treatment team.  Patients discharged the day of surgery will not be allowed to drive home.    Special instructions:   [place]- Preparing For Surgery  Before surgery, you can play an important role. Because skin is not sterile, your skin needs to be as free of germs as possible. You can reduce the number of germs on your skin by washing with CHG (chlorahexidine gluconate) Soap  before surgery.  CHG is an antiseptic cleaner which kills germs and bonds with the skin to continue killing germs even after washing.    Oral Hygiene is also important to reduce your risk of infection.  Remember - BRUSH YOUR TEETH THE MORNING OF SURGERY WITH YOUR REGULAR TOOTHPASTE  Please do not use if you have an allergy to CHG or antibacterial soaps. If your skin becomes reddened/irritated stop using the CHG.  Do not shave (including legs and underarms) for at least 48 hours prior to first CHG shower. It is OK to shave your face.  Please follow these instructions carefully.   1. Shower the NIGHT BEFORE SURGERY and the MORNING OF SURGERY with CHG Soap.   2. If you chose to wash your hair, wash your hair first as usual with your normal shampoo.  3. After you shampoo, rinse your hair and body thoroughly to remove the shampoo.  4. Use CHG as you would any other liquid soap. You can apply CHG directly to the skin and wash gently with a scrungie or a clean washcloth.   5. Apply the CHG Soap to your body ONLY FROM THE NECK DOWN.  Do not use on open wounds or open sores. Avoid contact with your eyes, ears, mouth and genitals (private parts). Wash Face and genitals (private parts)  with your normal soap.   6. Wash thoroughly, paying special attention to the area where your  surgery will be performed.  7. Thoroughly rinse your body with warm water from the neck down.  8. DO NOT shower/wash with your normal soap after using and rinsing off the CHG Soap.  9. Pat yourself dry with a CLEAN TOWEL.  10. Wear CLEAN PAJAMAS to bed the night before surgery, wear comfortable clothes the morning of surgery  11. Place CLEAN SHEETS on your bed the night of your first shower and DO NOT SLEEP WITH PETS.    Day of Surgery:  Please shower the morning of surgery with the CHG soap Do not apply any deodorants/lotions. Please wear clean clothes to the hospital/surgery center.   Remember to brush your teeth  WITH YOUR REGULAR TOOTHPASTE.   Please read over the following fact sheets that you were given.

## 2019-05-06 ENCOUNTER — Ambulatory Visit (HOSPITAL_COMMUNITY)
Admission: RE | Admit: 2019-05-06 | Discharge: 2019-05-06 | Disposition: A | Discharge status: Home / Self Care | Payer: No Typology Code available for payment source | Source: Ambulatory Visit | Attending: Orthopedic Surgery | Admitting: Orthopedic Surgery

## 2019-05-06 ENCOUNTER — Other Ambulatory Visit (HOSPITAL_COMMUNITY)
Admission: RE | Admit: 2019-05-06 | Discharge: 2019-05-06 | Disposition: A | Discharge status: Home / Self Care | Payer: No Typology Code available for payment source | Source: Ambulatory Visit | Attending: Orthopedic Surgery | Admitting: Orthopedic Surgery

## 2019-05-06 ENCOUNTER — Other Ambulatory Visit: Payer: Self-pay

## 2019-05-06 ENCOUNTER — Encounter (HOSPITAL_COMMUNITY): Payer: Self-pay

## 2019-05-06 ENCOUNTER — Encounter (HOSPITAL_COMMUNITY)
Admission: RE | Admit: 2019-05-06 | Discharge: 2019-05-06 | Disposition: A | Discharge status: Home / Self Care | Payer: No Typology Code available for payment source | Source: Ambulatory Visit | Attending: Orthopedic Surgery | Admitting: Orthopedic Surgery

## 2019-05-06 DIAGNOSIS — Z01818 Encounter for other preprocedural examination: Secondary | ICD-10-CM

## 2019-05-06 LAB — URINALYSIS, ROUTINE W REFLEX MICROSCOPIC
Bacteria, UA: NONE SEEN
Bilirubin Urine: NEGATIVE
Glucose, UA: NEGATIVE mg/dL
Hgb urine dipstick: NEGATIVE
Ketones, ur: NEGATIVE mg/dL
Nitrite: NEGATIVE
Protein, ur: NEGATIVE mg/dL
Specific Gravity, Urine: 1.017 (ref 1.005–1.030)
pH: 5 (ref 5.0–8.0)

## 2019-05-06 LAB — TYPE AND SCREEN
ABO/RH(D): O POS
Antibody Screen: NEGATIVE

## 2019-05-06 LAB — CBC
HCT: 43 % (ref 36.0–46.0)
Hemoglobin: 14.6 g/dL (ref 12.0–15.0)
MCH: 34.9 pg — ABNORMAL HIGH (ref 26.0–34.0)
MCHC: 34 g/dL (ref 30.0–36.0)
MCV: 102.9 fL — ABNORMAL HIGH (ref 80.0–100.0)
Platelets: 272 10*3/uL (ref 150–400)
RBC: 4.18 MIL/uL (ref 3.87–5.11)
RDW: 12.1 % (ref 11.5–15.5)
WBC: 6 10*3/uL (ref 4.0–10.5)
nRBC: 0 % (ref 0.0–0.2)

## 2019-05-06 LAB — SURGICAL PCR SCREEN
MRSA, PCR: NEGATIVE
Staphylococcus aureus: POSITIVE — AB

## 2019-05-06 LAB — BASIC METABOLIC PANEL
Anion gap: 7 (ref 5–15)
BUN: 8 mg/dL (ref 6–20)
CO2: 25 mmol/L (ref 22–32)
Calcium: 9.1 mg/dL (ref 8.9–10.3)
Chloride: 106 mmol/L (ref 98–111)
Creatinine, Ser: 0.9 mg/dL (ref 0.44–1.00)
GFR calc Af Amer: >60 mL/min (ref >60)
GFR calc non Af Amer: >60 mL/min (ref >60)
Glucose, Bld: 86 mg/dL (ref 70–99)
Potassium: 3.7 mmol/L (ref 3.5–5.1)
Sodium: 138 mmol/L (ref 135–145)

## 2019-05-06 LAB — SARS CORONAVIRUS 2 (TAT 6-24 HRS): SARS Coronavirus 2: NEGATIVE

## 2019-05-06 LAB — ABO/RH: ABO/RH(D): O POS

## 2019-05-06 LAB — APTT: aPTT: 31 seconds (ref 24–36)

## 2019-05-06 NOTE — Progress Notes (Signed)
PCP - pt does not have one OBGYN - Maryelizabeth Rowan, PA Cardiologist - pt denies  Chest x-ray - 05/06/19 EKG - documents requested from [facility]. Dr. Excell Seltzer Stress Test - pt denies ECHO - pt denies Cardiac Cath - pt denies    COVID TEST- 05/06/19 after PAT appt.  Coronavirus Screening  Have you experienced the following symptoms:  Cough yes/no: No Fever (>100.44F)  yes/no: No Runny nose yes/no: No Sore throat yes/no: No Difficulty breathing/shortness of breath  yes/no: No  Have you or a family member traveled in the last 14 days and where? yes/no: No   If the patient indicates "YES" to the above questions, their PAT will be rescheduled to limit the exposure to others and, the surgeon will be notified. THE PATIENT WILL NEED TO BE ASYMPTOMATIC FOR 14 DAYS.   If the patient is not experiencing any of these symptoms, the PAT nurse will instruct them to NOT bring anyone with them to their appointment since they may have these symptoms or traveled as well.   Please remind your patients and families that hospital visitation restrictions are in effect and the importance of the restrictions.     Anesthesia review: n/a  Patient denies shortness of breath, fever, cough and chest pain at PAT appointment   All instructions explained to the patient, with a verbal understanding of the material. Patient agrees to go over the instructions while at home for a better understanding. Patient also instructed to self quarantine after being tested for COVID-19. The opportunity to ask questions was provided.

## 2019-05-07 NOTE — Anesthesia Preprocedure Evaluation (Addendum)
Anesthesia Evaluation  Patient identified by MRN, date of birth, ID band Patient awake    Reviewed: Allergy & Precautions, NPO status , Patient's Chart, lab work & pertinent test results  Airway Mallampati: II  TM Distance: >3 FB Neck ROM: Full    Dental  (+) Teeth Intact, Dental Advisory Given, Caps,    Pulmonary neg pulmonary ROS,    Pulmonary exam normal breath sounds clear to auscultation       Cardiovascular negative cardio ROS Normal cardiovascular exam Rhythm:Regular Rate:Normal     Neuro/Psych Lumbar post-laminectomy syndrome degenerative disc disease L4-5     GI/Hepatic negative GI ROS, Neg liver ROS,   Endo/Other  Obesity   Renal/GU negative Renal ROS     Musculoskeletal  (+) Arthritis ,   Abdominal   Peds  Hematology negative hematology ROS (+)   Anesthesia Other Findings Day of surgery medications reviewed with the patient.  Reproductive/Obstetrics                            Anesthesia Physical Anesthesia Plan  ASA: II  Anesthesia Plan: General   Post-op Pain Management:  Regional for Post-op pain   Induction: Intravenous  PONV Risk Score and Plan: 3 and Midazolam, Diphenhydramine, Scopolamine patch - Pre-op, Dexamethasone and Ondansetron  Airway Management Planned: Oral ETT  Additional Equipment:   Intra-op Plan:   Post-operative Plan: Extubation in OR  Informed Consent: I have reviewed the patients History and Physical, chart, labs and discussed the procedure including the risks, benefits and alternatives for the proposed anesthesia with the patient or authorized representative who has indicated his/her understanding and acceptance.     Dental advisory given  Plan Discussed with: CRNA  Anesthesia Plan Comments: (Surgical clearance and EKG from PCP on pt chart. EKG dated 04/11/19 shows NSR rate 80.  **Pre-op L TAP Block. 2nd PIV after induction.**)       Anesthesia Quick Evaluation

## 2019-05-08 ENCOUNTER — Inpatient Hospital Stay (HOSPITAL_COMMUNITY): Payer: Worker's Compensation

## 2019-05-08 ENCOUNTER — Encounter (HOSPITAL_COMMUNITY)
Admission: RE | Disposition: A | Discharge status: Home / Self Care | Payer: Self-pay | Source: Home / Self Care | Attending: Orthopedic Surgery

## 2019-05-08 ENCOUNTER — Encounter (HOSPITAL_COMMUNITY): Payer: Self-pay | Admitting: Orthopedic Surgery

## 2019-05-08 ENCOUNTER — Inpatient Hospital Stay (HOSPITAL_COMMUNITY)
Admission: RE | Admit: 2019-05-08 | Discharge: 2019-05-09 | DRG: 460 | Disposition: A | Discharge status: Home / Self Care | Payer: No Typology Code available for payment source | Attending: Orthopedic Surgery | Admitting: Orthopedic Surgery

## 2019-05-08 ENCOUNTER — Other Ambulatory Visit: Payer: Self-pay

## 2019-05-08 ENCOUNTER — Inpatient Hospital Stay (HOSPITAL_COMMUNITY): Payer: No Typology Code available for payment source | Admitting: Physician Assistant

## 2019-05-08 ENCOUNTER — Inpatient Hospital Stay (HOSPITAL_COMMUNITY): Payer: No Typology Code available for payment source | Admitting: Anesthesiology

## 2019-05-08 DIAGNOSIS — M1990 Unspecified osteoarthritis, unspecified site: Secondary | ICD-10-CM | POA: Diagnosis present

## 2019-05-08 DIAGNOSIS — M2578 Osteophyte, vertebrae: Secondary | ICD-10-CM | POA: Diagnosis present

## 2019-05-08 DIAGNOSIS — Z981 Arthrodesis status: Secondary | ICD-10-CM | POA: Diagnosis not present

## 2019-05-08 DIAGNOSIS — Z808 Family history of malignant neoplasm of other organs or systems: Secondary | ICD-10-CM

## 2019-05-08 DIAGNOSIS — Z6831 Body mass index (BMI) 31.0-31.9, adult: Secondary | ICD-10-CM | POA: Diagnosis not present

## 2019-05-08 DIAGNOSIS — Z20822 Contact with and (suspected) exposure to covid-19: Secondary | ICD-10-CM | POA: Diagnosis present

## 2019-05-08 DIAGNOSIS — M5116 Intervertebral disc disorders with radiculopathy, lumbar region: Secondary | ICD-10-CM | POA: Diagnosis present

## 2019-05-08 DIAGNOSIS — E669 Obesity, unspecified: Secondary | ICD-10-CM | POA: Diagnosis present

## 2019-05-08 DIAGNOSIS — M5416 Radiculopathy, lumbar region: Secondary | ICD-10-CM | POA: Diagnosis not present

## 2019-05-08 DIAGNOSIS — M961 Postlaminectomy syndrome, not elsewhere classified: Principal | ICD-10-CM | POA: Diagnosis present

## 2019-05-08 DIAGNOSIS — Z419 Encounter for procedure for purposes other than remedying health state, unspecified: Secondary | ICD-10-CM

## 2019-05-08 DIAGNOSIS — Z803 Family history of malignant neoplasm of breast: Secondary | ICD-10-CM | POA: Diagnosis not present

## 2019-05-08 HISTORY — PX: ANTERIOR LUMBAR FUSION: SHX1170

## 2019-05-08 HISTORY — PX: ABDOMINAL EXPOSURE: SHX5708

## 2019-05-08 LAB — CBC
HCT: 40.8 % (ref 36.0–46.0)
Hemoglobin: 14.1 g/dL (ref 12.0–15.0)
MCH: 34.8 pg — ABNORMAL HIGH (ref 26.0–34.0)
MCHC: 34.6 g/dL (ref 30.0–36.0)
MCV: 100.7 fL — ABNORMAL HIGH (ref 80.0–100.0)
Platelets: 288 10*3/uL (ref 150–400)
RBC: 4.05 MIL/uL (ref 3.87–5.11)
RDW: 11.9 % (ref 11.5–15.5)
WBC: 11 10*3/uL — ABNORMAL HIGH (ref 4.0–10.5)
nRBC: 0 % (ref 0.0–0.2)

## 2019-05-08 LAB — CREATININE, SERUM
Creatinine, Ser: 1.06 mg/dL — ABNORMAL HIGH (ref 0.44–1.00)
GFR calc Af Amer: >60 mL/min (ref >60)
GFR calc non Af Amer: >60 mL/min (ref >60)

## 2019-05-08 LAB — POCT PREGNANCY, URINE: Preg Test, Ur: NEGATIVE

## 2019-05-08 SURGERY — ANTERIOR LUMBAR FUSION 1 LEVEL
Anesthesia: General | Site: Spine Lumbar

## 2019-05-08 MED ORDER — ENOXAPARIN SODIUM 40 MG/0.4ML ~~LOC~~ SOLN
40.0000 mg | SUBCUTANEOUS | Status: DC
  Administered 2019-05-09: 40 mg via SUBCUTANEOUS
  Filled 2019-05-08: qty 0.4

## 2019-05-08 MED ORDER — ROCURONIUM BROMIDE 10 MG/ML (PF) SYRINGE
PREFILLED_SYRINGE | INTRAVENOUS | Status: DC | PRN
  Administered 2019-05-08 (×2): 20 mg via INTRAVENOUS
  Administered 2019-05-08: 30 mg via INTRAVENOUS
  Administered 2019-05-08: 10 mg via INTRAVENOUS
  Administered 2019-05-08: 50 mg via INTRAVENOUS
  Administered 2019-05-08: 20 mg via INTRAVENOUS

## 2019-05-08 MED ORDER — 0.9 % SODIUM CHLORIDE (POUR BTL) OPTIME
TOPICAL | Status: DC | PRN
  Administered 2019-05-08 (×2): 1000 mL

## 2019-05-08 MED ORDER — CHLORHEXIDINE GLUCONATE 4 % EX LIQD: 60.0000 mL | Freq: Once | CUTANEOUS | Status: DC

## 2019-05-08 MED ORDER — HEMOSTATIC AGENTS (NO CHARGE) OPTIME
TOPICAL | Status: DC | PRN
  Administered 2019-05-08: 1 via TOPICAL

## 2019-05-08 MED ORDER — ACETAMINOPHEN 10 MG/ML IV SOLN
INTRAVENOUS | Status: DC | PRN
  Administered 2019-05-08: 1000 mg via INTRAVENOUS

## 2019-05-08 MED ORDER — ACETAMINOPHEN 10 MG/ML IV SOLN
INTRAVENOUS | Status: AC
  Filled 2019-05-08: qty 100

## 2019-05-08 MED ORDER — GABAPENTIN 300 MG PO CAPS
300.0000 mg | ORAL_CAPSULE | Freq: Three times a day (TID) | ORAL | Status: DC
  Administered 2019-05-08 – 2019-05-09 (×3): 300 mg via ORAL
  Filled 2019-05-08 (×3): qty 1

## 2019-05-08 MED ORDER — METHOCARBAMOL 1000 MG/10ML IJ SOLN
500.0000 mg | Freq: Four times a day (QID) | INTRAVENOUS | Status: DC | PRN
  Filled 2019-05-08: qty 5

## 2019-05-08 MED ORDER — MAGNESIUM CITRATE PO SOLN: 1.0000 | Freq: Once | ORAL | Status: DC | PRN

## 2019-05-08 MED ORDER — ONDANSETRON HCL 4 MG PO TABS: 4.0000 mg | ORAL_TABLET | Freq: Three times a day (TID) | ORAL | 0 refills | Status: DC | PRN

## 2019-05-08 MED ORDER — PROMETHAZINE HCL 25 MG/ML IJ SOLN: 6.2500 mg | INTRAMUSCULAR | Status: DC | PRN

## 2019-05-08 MED ORDER — FLUTICASONE PROPIONATE 50 MCG/ACT NA SUSP: 2.0000 | Freq: Every day | NASAL | Status: DC | PRN

## 2019-05-08 MED ORDER — SODIUM CHLORIDE 0.9% FLUSH: 3.0000 mL | INTRAVENOUS | Status: DC | PRN

## 2019-05-08 MED ORDER — DEXAMETHASONE SODIUM PHOSPHATE 10 MG/ML IJ SOLN
INTRAMUSCULAR | Status: AC
  Filled 2019-05-08: qty 1

## 2019-05-08 MED ORDER — LACTATED RINGERS IV SOLN: INTRAVENOUS | Status: DC

## 2019-05-08 MED ORDER — PHENOL 1.4 % MT LIQD: 1.0000 | OROMUCOSAL | Status: DC | PRN

## 2019-05-08 MED ORDER — MIDAZOLAM HCL 5 MG/5ML IJ SOLN
INTRAMUSCULAR | Status: DC | PRN
  Administered 2019-05-08: 2 mg via INTRAVENOUS

## 2019-05-08 MED ORDER — ENOXAPARIN SODIUM 40 MG/0.4ML ~~LOC~~ SOLN: 40.0000 mg | SUBCUTANEOUS | 0 refills | Status: DC

## 2019-05-08 MED ORDER — FENTANYL CITRATE (PF) 100 MCG/2ML IJ SOLN
INTRAMUSCULAR | Status: AC
  Filled 2019-05-08: qty 2

## 2019-05-08 MED ORDER — OXYCODONE HCL 5 MG PO TABS
ORAL_TABLET | ORAL | Status: AC
  Filled 2019-05-08: qty 2

## 2019-05-08 MED ORDER — THROMBIN (RECOMBINANT) 20000 UNITS EX SOLR
CUTANEOUS | Status: AC
  Filled 2019-05-08: qty 20000

## 2019-05-08 MED ORDER — KETAMINE HCL 10 MG/ML IJ SOLN
INTRAMUSCULAR | Status: DC | PRN
  Administered 2019-05-08 (×4): 10 mg via INTRAVENOUS

## 2019-05-08 MED ORDER — ONDANSETRON HCL 4 MG/2ML IJ SOLN: 4.0000 mg | Freq: Four times a day (QID) | INTRAMUSCULAR | Status: DC | PRN

## 2019-05-08 MED ORDER — SUGAMMADEX SODIUM 200 MG/2ML IV SOLN
INTRAVENOUS | Status: DC | PRN
  Administered 2019-05-08: 200 mg via INTRAVENOUS

## 2019-05-08 MED ORDER — METHOCARBAMOL 500 MG PO TABS
500.0000 mg | ORAL_TABLET | Freq: Four times a day (QID) | ORAL | Status: DC | PRN
  Administered 2019-05-08 – 2019-05-09 (×4): 500 mg via ORAL
  Filled 2019-05-08 (×4): qty 1

## 2019-05-08 MED ORDER — POLYETHYLENE GLYCOL 3350 17 G PO PACK: 17.0000 g | PACK | Freq: Every day | ORAL | Status: DC | PRN

## 2019-05-08 MED ORDER — MAGNESIUM CITRATE PO SOLN
1.0000 | Freq: Once | ORAL | Status: AC
  Administered 2019-05-09: 1 via ORAL
  Filled 2019-05-08: qty 296

## 2019-05-08 MED ORDER — SODIUM CHLORIDE 0.9% FLUSH
3.0000 mL | Freq: Two times a day (BID) | INTRAVENOUS | Status: DC
  Administered 2019-05-08 (×2): 3 mL via INTRAVENOUS

## 2019-05-08 MED ORDER — FENTANYL CITRATE (PF) 100 MCG/2ML IJ SOLN
25.0000 ug | INTRAMUSCULAR | Status: DC | PRN
  Administered 2019-05-08 (×3): 50 ug via INTRAVENOUS

## 2019-05-08 MED ORDER — CEFAZOLIN SODIUM-DEXTROSE 1-4 GM/50ML-% IV SOLN
1.0000 g | Freq: Three times a day (TID) | INTRAVENOUS | Status: AC
  Administered 2019-05-08 (×2): 1 g via INTRAVENOUS
  Filled 2019-05-08 (×3): qty 50

## 2019-05-08 MED ORDER — OXYCODONE-ACETAMINOPHEN 10-325 MG PO TABS: 1.0000 | ORAL_TABLET | Freq: Four times a day (QID) | ORAL | 0 refills | Status: AC | PRN

## 2019-05-08 MED ORDER — ROCURONIUM BROMIDE 10 MG/ML (PF) SYRINGE
PREFILLED_SYRINGE | INTRAVENOUS | Status: AC
  Filled 2019-05-08: qty 20

## 2019-05-08 MED ORDER — FENTANYL CITRATE (PF) 250 MCG/5ML IJ SOLN
INTRAMUSCULAR | Status: AC
  Filled 2019-05-08: qty 5

## 2019-05-08 MED ORDER — LACTATED RINGERS IV SOLN: INTRAVENOUS | Status: DC | PRN

## 2019-05-08 MED ORDER — ONDANSETRON HCL 4 MG/2ML IJ SOLN
INTRAMUSCULAR | Status: DC | PRN
  Administered 2019-05-08: 4 mg via INTRAVENOUS

## 2019-05-08 MED ORDER — ONDANSETRON HCL 4 MG/2ML IJ SOLN
INTRAMUSCULAR | Status: AC
  Filled 2019-05-08: qty 2

## 2019-05-08 MED ORDER — BUPIVACAINE HCL (PF) 0.25 % IJ SOLN
INTRAMUSCULAR | Status: AC
  Filled 2019-05-08: qty 30

## 2019-05-08 MED ORDER — MENTHOL 3 MG MT LOZG: 1.0000 | LOZENGE | OROMUCOSAL | Status: DC | PRN

## 2019-05-08 MED ORDER — EPINEPHRINE PF 1 MG/ML IJ SOLN
INTRAMUSCULAR | Status: AC
  Filled 2019-05-08: qty 1

## 2019-05-08 MED ORDER — OXYCODONE HCL 5 MG PO TABS: 5.0000 mg | ORAL_TABLET | ORAL | Status: DC | PRN

## 2019-05-08 MED ORDER — METHOCARBAMOL 500 MG PO TABS
ORAL_TABLET | ORAL | Status: AC
  Filled 2019-05-08: qty 1

## 2019-05-08 MED ORDER — METHOCARBAMOL 500 MG PO TABS: 500.0000 mg | ORAL_TABLET | Freq: Three times a day (TID) | ORAL | 0 refills | Status: AC | PRN

## 2019-05-08 MED ORDER — CEFAZOLIN SODIUM-DEXTROSE 2-4 GM/100ML-% IV SOLN
INTRAVENOUS | Status: AC
  Filled 2019-05-08: qty 100

## 2019-05-08 MED ORDER — MORPHINE SULFATE (PF) 2 MG/ML IV SOLN
2.0000 mg | INTRAVENOUS | Status: AC | PRN
  Administered 2019-05-08: 2 mg via INTRAVENOUS
  Filled 2019-05-08: qty 1

## 2019-05-08 MED ORDER — TRANEXAMIC ACID-NACL 1000-0.7 MG/100ML-% IV SOLN
INTRAVENOUS | Status: AC
  Filled 2019-05-08: qty 100

## 2019-05-08 MED ORDER — ACETAMINOPHEN 650 MG RE SUPP: 650.0000 mg | RECTAL | Status: DC | PRN

## 2019-05-08 MED ORDER — LIDOCAINE 2% (20 MG/ML) 5 ML SYRINGE
INTRAMUSCULAR | Status: AC
  Filled 2019-05-08: qty 5

## 2019-05-08 MED ORDER — TRANEXAMIC ACID-NACL 1000-0.7 MG/100ML-% IV SOLN
1000.0000 mg | INTRAVENOUS | Status: AC
  Administered 2019-05-08: 1000 mg via INTRAVENOUS
  Filled 2019-05-08: qty 100

## 2019-05-08 MED ORDER — OXYCODONE HCL 5 MG PO TABS
10.0000 mg | ORAL_TABLET | ORAL | Status: DC | PRN
  Administered 2019-05-08 – 2019-05-09 (×8): 10 mg via ORAL
  Filled 2019-05-08 (×8): qty 2

## 2019-05-08 MED ORDER — ACETAMINOPHEN 325 MG PO TABS: 650.0000 mg | ORAL_TABLET | ORAL | Status: DC | PRN

## 2019-05-08 MED ORDER — MIDAZOLAM HCL 2 MG/2ML IJ SOLN
INTRAMUSCULAR | Status: AC
  Filled 2019-05-08: qty 2

## 2019-05-08 MED ORDER — PROPOFOL 10 MG/ML IV BOLUS
INTRAVENOUS | Status: DC | PRN
  Administered 2019-05-08: 180 mg via INTRAVENOUS

## 2019-05-08 MED ORDER — LIDOCAINE 2% (20 MG/ML) 5 ML SYRINGE
INTRAMUSCULAR | Status: DC | PRN
  Administered 2019-05-08: 80 mg via INTRAVENOUS

## 2019-05-08 MED ORDER — CEFAZOLIN SODIUM-DEXTROSE 2-4 GM/100ML-% IV SOLN
2.0000 g | INTRAVENOUS | Status: AC
  Administered 2019-05-08: 2 g via INTRAVENOUS

## 2019-05-08 MED ORDER — ONDANSETRON HCL 4 MG PO TABS: 4.0000 mg | ORAL_TABLET | Freq: Four times a day (QID) | ORAL | Status: DC | PRN

## 2019-05-08 MED ORDER — DEXAMETHASONE SODIUM PHOSPHATE 10 MG/ML IJ SOLN
INTRAMUSCULAR | Status: DC | PRN
  Administered 2019-05-08: 10 mg via INTRAVENOUS

## 2019-05-08 MED ORDER — FENTANYL CITRATE (PF) 100 MCG/2ML IJ SOLN
INTRAMUSCULAR | Status: DC | PRN
  Administered 2019-05-08: 50 ug via INTRAVENOUS
  Administered 2019-05-08: 100 ug via INTRAVENOUS
  Administered 2019-05-08 (×4): 50 ug via INTRAVENOUS

## 2019-05-08 MED ORDER — KETAMINE HCL 50 MG/5ML IJ SOSY
PREFILLED_SYRINGE | INTRAMUSCULAR | Status: AC
  Filled 2019-05-08: qty 5

## 2019-05-08 MED ORDER — GABAPENTIN 300 MG PO CAPS: 300.0000 mg | ORAL_CAPSULE | Freq: Three times a day (TID) | ORAL | 0 refills | Status: DC | PRN

## 2019-05-08 MED ORDER — PROPOFOL 10 MG/ML IV BOLUS
INTRAVENOUS | Status: AC
  Filled 2019-05-08: qty 40

## 2019-05-08 SURGICAL SUPPLY — 99 items
ADH SKN CLS APL DERMABOND .7 (GAUZE/BANDAGES/DRESSINGS) ×2
AGENT HMST KT MTR STRL THRMB (HEMOSTASIS) ×2
APPLIER CLIP 11 MED OPEN (CLIP) ×8
APR CLP MED 11 20 MLT OPN (CLIP) ×4
BLADE CLIPPER SURG (BLADE) IMPLANT
BLADE SURG 10 STRL SS (BLADE) ×4 IMPLANT
BONE VIVIGEN FORMABLE 5.4CC (Bone Implant) ×4 IMPLANT
BUR EGG ELITE 4.0 (BURR) ×3 IMPLANT
BUR EGG ELITE 4.0MM (BURR) ×1
BUR ROUND FLUTED 4 SOFT TCH (BURR) ×1 IMPLANT
BUR ROUND FLUTED 4MM SOFT TCH (BURR) ×1
CABLE BIPOLOR RESECTION CORD (MISCELLANEOUS) ×4 IMPLANT
CATH FOLEY 2WAY SLVR  5CC 16FR (CATHETERS) ×4
CATH FOLEY 2WAY SLVR 5CC 16FR (CATHETERS) ×2 IMPLANT
CLIP APPLIE 11 MED OPEN (CLIP) ×4 IMPLANT
CLIP LIGATING EXTRA MED SLVR (CLIP) ×4 IMPLANT
CLIP LIGATING EXTRA SM BLUE (MISCELLANEOUS) ×4 IMPLANT
CLOSURE STERI-STRIP 1/2X4 (GAUZE/BANDAGES/DRESSINGS) ×1
CLSR STERI-STRIP ANTIMIC 1/2X4 (GAUZE/BANDAGES/DRESSINGS) ×1 IMPLANT
COVER ASSEMBLY 16MM (Orthopedic Implant) ×2 IMPLANT
COVER SURGICAL LIGHT HANDLE (MISCELLANEOUS) ×4 IMPLANT
COVER WAND RF STERILE (DRAPES) ×8 IMPLANT
DERMABOND ADVANCED (GAUZE/BANDAGES/DRESSINGS) ×2
DERMABOND ADVANCED .7 DNX12 (GAUZE/BANDAGES/DRESSINGS) ×2 IMPLANT
DRAPE C-ARM 42X72 X-RAY (DRAPES) ×8 IMPLANT
DRAPE C-ARMOR (DRAPES) ×4 IMPLANT
DRAPE POUCH INSTRU U-SHP 10X18 (DRAPES) ×4 IMPLANT
DRAPE SURG 17X23 STRL (DRAPES) ×4 IMPLANT
DRAPE U-SHAPE 47X51 STRL (DRAPES) ×4 IMPLANT
DRSG OPSITE POSTOP 4X8 (GAUZE/BANDAGES/DRESSINGS) ×4 IMPLANT
DURAPREP 26ML APPLICATOR (WOUND CARE) ×4 IMPLANT
ELECT BLADE 4.0 EZ CLEAN MEGAD (MISCELLANEOUS) ×8
ELECT CAUTERY BLADE 6.4 (BLADE) ×4 IMPLANT
ELECT PENCIL ROCKER SW 15FT (MISCELLANEOUS) ×4 IMPLANT
ELECT REM PT RETURN 9FT ADLT (ELECTROSURGICAL) ×4
ELECTRODE BLDE 4.0 EZ CLN MEGD (MISCELLANEOUS) ×4 IMPLANT
ELECTRODE REM PT RTRN 9FT ADLT (ELECTROSURGICAL) ×2 IMPLANT
GAUZE 4X4 16PLY RFD (DISPOSABLE) IMPLANT
GLOVE BIO SURGEON STRL SZ 6.5 (GLOVE) ×3 IMPLANT
GLOVE BIO SURGEON STRL SZ7.5 (GLOVE) IMPLANT
GLOVE BIO SURGEONS STRL SZ 6.5 (GLOVE) ×1
GLOVE BIOGEL PI IND STRL 6.5 (GLOVE) ×2 IMPLANT
GLOVE BIOGEL PI IND STRL 8.5 (GLOVE) ×4 IMPLANT
GLOVE BIOGEL PI INDICATOR 6.5 (GLOVE) ×2
GLOVE BIOGEL PI INDICATOR 8.5 (GLOVE) ×4
GLOVE SS BIOGEL STRL SZ 7.5 (GLOVE) ×2 IMPLANT
GLOVE SS BIOGEL STRL SZ 8.5 (GLOVE) ×4 IMPLANT
GLOVE SUPERSENSE BIOGEL SZ 7.5 (GLOVE) ×2
GLOVE SUPERSENSE BIOGEL SZ 8.5 (GLOVE) ×4
GOWN STRL REUS W/ TWL LRG LVL3 (GOWN DISPOSABLE) ×4 IMPLANT
GOWN STRL REUS W/TWL 2XL LVL3 (GOWN DISPOSABLE) ×12 IMPLANT
GOWN STRL REUS W/TWL LRG LVL3 (GOWN DISPOSABLE) ×8
GRAFT BNE MATRIX VG FRMBL MD 5 (Bone Implant) IMPLANT
HEMOSTAT SNOW SURGICEL 2X4 (HEMOSTASIS) IMPLANT
INSERT FOGARTY 61MM (MISCELLANEOUS) IMPLANT
INSERT FOGARTY SM (MISCELLANEOUS) IMPLANT
INTERPLATE 39X16X8 (Plate) ×4 IMPLANT
KIT BASIN OR (CUSTOM PROCEDURE TRAY) ×4 IMPLANT
KIT TURNOVER KIT B (KITS) ×4 IMPLANT
LOOP VESSEL MAXI BLUE (MISCELLANEOUS) IMPLANT
LOOP VESSEL MINI RED (MISCELLANEOUS) IMPLANT
NDL SPNL 18GX3.5 QUINCKE PK (NEEDLE) ×2 IMPLANT
NEEDLE SPNL 18GX3.5 QUINCKE PK (NEEDLE) ×4 IMPLANT
NS IRRIG 1000ML POUR BTL (IV SOLUTION) ×4 IMPLANT
PACK LAMINECTOMY ORTHO (CUSTOM PROCEDURE TRAY) ×4 IMPLANT
PACK UNIVERSAL I (CUSTOM PROCEDURE TRAY) ×4 IMPLANT
PAD ARMBOARD 7.5X6 YLW CONV (MISCELLANEOUS) ×16 IMPLANT
PEEK SPACER INTERPLAT 35X16X8 (Peek) ×2 IMPLANT
PLATE SPINAL INTERPLAT 39X16X8 (Plate) IMPLANT
SCREW BONE RESCUE (Screw) ×6 IMPLANT
SPONGE INTESTINAL PEANUT (DISPOSABLE) ×12 IMPLANT
SPONGE LAP 18X18 RF (DISPOSABLE) IMPLANT
SPONGE LAP 4X18 RFD (DISPOSABLE) IMPLANT
SPONGE SURGIFOAM ABS GEL 100 (HEMOSTASIS) ×2 IMPLANT
STAPLER VISISTAT 35W (STAPLE) IMPLANT
SURGIFLO W/THROMBIN 8M KIT (HEMOSTASIS) ×4 IMPLANT
SUT BONE WAX W31G (SUTURE) ×4 IMPLANT
SUT MON AB 3-0 SH 27 (SUTURE) ×4
SUT MON AB 3-0 SH27 (SUTURE) ×2 IMPLANT
SUT PDS AB 1 CTX 36 (SUTURE) ×4 IMPLANT
SUT PROLENE 4 0 RB 1 (SUTURE)
SUT PROLENE 4-0 RB1 .5 CRCL 36 (SUTURE) IMPLANT
SUT PROLENE 5 0 C 1 24 (SUTURE) IMPLANT
SUT PROLENE 5 0 CC1 (SUTURE) IMPLANT
SUT PROLENE 6 0 C 1 30 (SUTURE) ×4 IMPLANT
SUT PROLENE 6 0 CC (SUTURE) IMPLANT
SUT SILK 0 TIES 10X30 (SUTURE) ×4 IMPLANT
SUT SILK 2 0 TIES 10X30 (SUTURE) ×8 IMPLANT
SUT SILK 2 0SH CR/8 30 (SUTURE) IMPLANT
SUT SILK 3 0 TIES 10X30 (SUTURE) ×8 IMPLANT
SUT SILK 3 0SH CR/8 30 (SUTURE) IMPLANT
SUT VIC AB 1 CT1 27 (SUTURE) ×8
SUT VIC AB 1 CT1 27XBRD ANBCTR (SUTURE) ×4 IMPLANT
SUT VIC AB 2-0 CT1 18 (SUTURE) ×4 IMPLANT
SYR BULB IRRIGATION 50ML (SYRINGE) ×4 IMPLANT
TOWEL GREEN STERILE (TOWEL DISPOSABLE) ×8 IMPLANT
TOWEL GREEN STERILE FF (TOWEL DISPOSABLE) ×4 IMPLANT
TRAP SPECIMEN MUCOUS 40CC (MISCELLANEOUS) ×4 IMPLANT
WATER STERILE IRR 1000ML POUR (IV SOLUTION) ×4 IMPLANT

## 2019-05-08 NOTE — Brief Op Note (Signed)
05/08/2019  12:14 PM  PATIENT:  [name]  44 y.o. female  PRE-OPERATIVE DIAGNOSIS:  Post laminectomy syndrome, degenerative disc disease L4-5  POST-OPERATIVE DIAGNOSIS:  Post laminectomy syndrome, degenerative disc disease L4-5  PROCEDURE:  Procedure(s) with comments: ANTERIOR LUMBAR FUSION LUMBAR FOUR- LUMBAR FIVE (N/A) - 3.5 hrs Dr. Arbie Cookey to do approach Left side tap block with exparel ABDOMINAL EXPOSURE (N/A)  SURGEON:  Surgeon(s) and Role: Panel 1:    Venita Lick, MD - Primary Panel 2:    * Early, Kristen Loader, MD - Primary  PHYSICIAN ASSISTANT:   ASSISTANTS: Amanda Ward, PA   ANESTHESIA:   none  EBL:  100 mL   BLOOD ADMINISTERED:none  DRAINS: none   LOCAL MEDICATIONS USED:  NONE  SPECIMEN:  No Specimen  DISPOSITION OF SPECIMEN:  N/A  COUNTS:  YES  TOURNIQUET:  * No tourniquets in log *  DICTATION: .Dragon Dictation  PLAN OF CARE: Admit to inpatient   PATIENT DISPOSITION:  PACU - hemodynamically stable.

## 2019-05-08 NOTE — Anesthesia Procedure Notes (Signed)
Anesthesia Regional Block: TAP block   Pre-Anesthetic Checklist: ,, timeout performed, Correct Patient, Correct Site, Correct Laterality, Correct Procedure, Correct Position, site marked, Risks and benefits discussed,  Surgical consent,  Pre-op evaluation,  At surgeon's request and post-op pain management  Laterality: Left  Prep: chloraprep       Needles:  Injection technique: Single-shot  Needle Type: Echogenic Needle     Needle Length: 9cm  Needle Gauge: 21     Additional Needles:   Procedures:,,,, ultrasound used (permanent image in chart),,,,  Narrative:  Start time: 05/08/2019 8:03 AM End time: 05/08/2019 8:13 AM Injection made incrementally with aspirations every 5 mL.  Performed by: Personally  Anesthesiologist: Cecile Hearing, MD  Additional Notes: No pain on injection. No increased resistance to injection. Injection made in 5cc increments.  Good needle visualization.  Patient tolerated procedure well.

## 2019-05-08 NOTE — Anesthesia Procedure Notes (Signed)
Procedure Name: Intubation Date/Time: 05/08/2019 8:40 AM Performed by: Elliot Dally, CRNA Pre-anesthesia Checklist: Patient identified, Emergency Drugs available, Suction available and Patient being monitored Patient Re-evaluated:Patient Re-evaluated prior to induction Oxygen Delivery Method: Circle System Utilized Preoxygenation: Pre-oxygenation with 100% oxygen Induction Type: IV induction Ventilation: Mask ventilation without difficulty Laryngoscope Size: Miller and 2 Grade View: Grade I Tube type: Oral Tube size: 7.0 mm Number of attempts: 1 Airway Equipment and Method: Stylet and Oral airway Placement Confirmation: ETT inserted through vocal cords under direct vision,  positive ETCO2 and breath sounds checked- equal and bilateral Secured at: 24 cm Tube secured with: Tape Dental Injury: Teeth and Oropharynx as per pre-operative assessment

## 2019-05-08 NOTE — Op Note (Signed)
    OPERATIVE REPORT  DATE OF SURGERY: 05/08/2019  PATIENT: [name], 44 y.o. female MRN: [id number]  DOB: [birth year]  PRE-OPERATIVE DIAGNOSIS: Degenerative disc disease  POST-OPERATIVE DIAGNOSIS:  Same  PROCEDURE: Anterior exposure for L4-5 fusion  SURGEON:  Gretta Began, M.D.  Co-surgeon for the exposure Dr. Venita Lick  PHYSICIAN ASSISTANT: Marchelle Folks Ward, PA-C  ANESTHESIA: General  EBL: per anesthesia record  Total I/O In: 2000 [I.V.:1800; IV Piggyback:200] Out: 250 [Urine:100; Other:50; Blood:100]  BLOOD ADMINISTERED: none  DRAINS: none  SPECIMEN: none  COUNTS CORRECT:  YES  PATIENT DISPOSITION:  PACU - hemodynamically stable  PROCEDURE DETAILS: Patient was taken up and placed evaluation the area of the abdomen prepped draped in sterile fashion.  C arm was used identify the level of the 4 5 disc and this was marked on the surface of the skin.  An incision was made from the midline to the left and carried down through the subcutaneous fat to the anterior rectus sheath.  The anterior rectus sheath was mobilized and the posterior sheath was opened laterally.  Blunt dissection was used to continue to mobilize the intraperitoneal contents to the right.  The left ureter was identified and this was mobilized to the right.  The left iliac artery and vein was mobilized to the right.  Patient had multiple branches of iliolumbar veins and these were all ligated with 2-0 silk ties and divided.  There was some reaction from prior laminectomy.  The blunt dissection was continued over the 4 5 disc.  The Thompson retractor was used with a reverse lip 150 blade to the right and left of the L4-5 disc and malleable retractor superiorly and inferiorly.  A spinal needle was placed in the 4 5 disc and C-arm was brought back onto the field to confirm this was the appropriate level.  The remainder of the surgery will be dictated as a separate note with Dr. Jethro Bastos, M.D.,  [facility] 05/08/2019 1:00 PM

## 2019-05-08 NOTE — Transfer of Care (Signed)
Immediate Anesthesia Transfer of Care Note  Patient: [name]  Procedure(s) Performed: ANTERIOR LUMBAR FUSION LUMBAR FOUR- LUMBAR FIVE (N/A Spine Lumbar) ABDOMINAL EXPOSURE (N/A Abdomen)  Patient Location: PACU  Anesthesia Type:GA combined with regional for post-op pain  Level of Consciousness: awake, alert  and oriented  Airway & Oxygen Therapy: Patient Spontanous Breathing and Patient connected to nasal cannula oxygen  Post-op Assessment: Report given to RN and Post -op Vital signs reviewed and stable  Post vital signs: Reviewed and stable  Last Vitals:  Vitals Value Taken Time  BP 153/82 05/08/19 1251  Temp 36.5 C 05/08/19 1251  Pulse 102 05/08/19 1251  Resp 15 05/08/19 1251  SpO2 98 % 05/08/19 1251    Last Pain:  Vitals:   05/08/19 0727  PainSc: 4       Patients Stated Pain Goal: 3 (05/08/19 0727)  Complications: No apparent anesthesia complications

## 2019-05-08 NOTE — Anesthesia Postprocedure Evaluation (Signed)
Anesthesia Post Note  Patient: RASHANNA CHRISTIANA  Procedure(s) Performed: ANTERIOR LUMBAR FUSION LUMBAR FOUR- LUMBAR FIVE (N/A Spine Lumbar) ABDOMINAL EXPOSURE (N/A Abdomen)     Patient location during evaluation: PACU Anesthesia Type: General Level of consciousness: awake and alert Pain management: pain level controlled Vital Signs Assessment: post-procedure vital signs reviewed and stable Respiratory status: spontaneous breathing, nonlabored ventilation, respiratory function stable and patient connected to nasal cannula oxygen Cardiovascular status: blood pressure returned to baseline and stable Postop Assessment: no apparent nausea or vomiting Anesthetic complications: no    Last Vitals:  Vitals:   05/08/19 1335 05/08/19 1404  BP: (!) 142/86 (!) 145/80  Pulse: 98 (!) 110  Resp: 16 19  Temp: 36.7 C 36.6 C  SpO2: 94% 96%    Last Pain:  Vitals:   05/08/19 1404  TempSrc: Oral  PainSc:                  Cecile Hearing

## 2019-05-08 NOTE — H&P (Signed)
Addendum H&P  Patient is a very pleasant 44 year old man who had a previous lumbar decompression and discectomy and unfortunately has ongoing discogenic back pain.  Despite appropriate conservative management consisting of physical therapy, injection therapy, and activity modification her quality of life has continued to deteriorate.  Imaging studies confirmed degenerative L4-5 disc disease with previous laminectomy.  It should be noted that she does have sacralization of L5-S1 disc and so this does represent transitional anatomy.  Preoperative discogram confirmed with L4-5 as the primary pain generator.  Given the failure of conservative management she is elected to move forward with surgery.  I have gone over all the appropriate risks benefits and alternatives related to move forward with an anterior lumbar interbody fusion at L4-5.

## 2019-05-08 NOTE — Discharge Instructions (Signed)
°Spinal Fusion, Adult, Care After °This sheet gives you information about how to care for yourself after your procedure. Your doctor may also give you more specific instructions. If you have problems or questions, contact your doctor. °Follow these instructions at home: °Medicines °· Take over-the-counter and prescription medicines only as told by your doctor. These include any medicines for pain or blood-thinning medicines (anticoagulants). °· If you were prescribed an antibiotic medicine, take it as told by your doctor. Do not stop taking the antibiotic even if you start to feel better. °· Do not drive for 24 hours if you were given a medicine to help you relax (sedative) during your procedure. °· Do not drive or use heavy machinery while taking prescription pain medicine. °If you have a brace: °· Wear the brace as told by your doctor. Take it off only as told by your doctor. °· Keep the brace clean. °Managing pain, stiffness, and swelling °· If directed, put ice on the surgery area: °? If you have a removable brace, take it off as told by your doctor. °? Put ice in a plastic bag. °? Place a towel between your skin and the bag. °? Leave the ice on for 20 minutes, 2-3 times a day. °Surgery cut care ° °  °· Follow instructions from your doctor about how to take care of your cut from surgery (incision). Make sure you: °? Wash your hands with soap and water before you change your bandage (dressing). If you cannot use soap and water, use hand sanitizer. °? Change your bandage as told by your doctor. °? Leave stitches (sutures), skin glue, or skin tape (adhesive) strips in place. They may need to stay in place for 2 weeks or longer. If tape strips get loose and curl up, you may trim the loose edges. Do not remove tape strips completely unless your doctor says it is okay. °· Keep your cut from surgery clean and dry. °? Do not take baths, swim, or use a hot tub until your doctor says it is okay. °? Ask your doctor if you  can take showers. You may only be allowed to take sponge baths. °· Every day, check your cut from surgery and the area around it for: °? More redness, swelling, or pain. °? Fluid or blood. °? Warmth. °? Pus or a bad smell. °· If you have a drain tube, follow instructions from your doctor about caring for it. Do not take out the drain tube or any bandages unless your doctor says it is okay. °Physical activity °· Rest and protect your back as much as possible. °· Follow instructions from your doctor about how to move. Use good posture to help your spine heal. °· Do not lift anything that is heavier than 8 lb (3.6 kg), or the limit that you are told, until your doctor says that it is safe. °· Do not twist or bend at the waist until your doctor says it is okay. °· It is best if you: °? Do not make pushing and pulling motions. °? Do not sit or lie down in the same position for a long time. °? Do not raise your hands or arms above your head. °· Return to your normal activities as told by your doctor. Ask your doctor what activities are safe for you. Rest and protect your back as much as you can. °· Do not start to exercise until your doctor says it is okay. Ask your doctor what kinds of exercise you   can do to make your back stronger. °· Ok to shower in 5 days.  Do not take a bath or submerge the wound °General instructions °· To prevent blood clots and lessen swelling in your legs: °? Wear compression stockings as told. °? Walk one or more times every few hours as told by your doctor. °· Do not use any products that contain nicotine or tobacco, such as cigarettes and e-cigarettes. These can delay bone healing. If you need help quitting, ask your doctor. °· To prevent or treat constipation while you are taking prescription pain medicine, your doctor may suggest that you: °? Drink enough fluid to keep your pee (urine) pale yellow. °? Take over-the-counter or prescription medicines. °? Eat foods that are high in fiber. These  include fresh fruits and vegetables, whole grains, and beans. °? Limit foods that are high in fat and processed sugars, such as fried and sweet foods. °· Keep all follow-up visits as told by your doctor. This is important. °Contact a doctor if: °· Your pain gets worse. °· Your medicine does not help your pain. °· Your legs or feet get painful or swollen. °· Your cut from surgery is more red, swollen, or painful. °· Your cut from surgery feels warm to the touch. °· You have: °? Fluid or blood coming from your cut from surgery. °? Pus or a bad smell coming from your cut from surgery. °? A fever. °? Weakness or loss of feeling (numbness) in your legs that is new or getting worse. °? Trouble controlling when you pee (urinate) or poop (have a bowel movement). °· You feel sick to your stomach (nauseous). °· You throw up (vomit). °Get help right away if: °· Your pain is very bad. °· You have chest pain. °· You have trouble breathing. °· You start to have a cough. °These symptoms may be an emergency. Do not wait to see if the symptoms will go away. Get medical help right away. Call your local emergency services (911 in the [place] Do not drive yourself to the hospital. °Summary °· After the procedure, it is common to have pain in your back and pain by your surgery cut(s). °· Icing and pain medicines may help to control the pain. Follow directions from your doctor. °· Rest and protect your back as much as possible. Do not twist or bend at the waist. °· Get up and walk one or more times every few hours as told by your doctor. °This information is not intended to replace advice given to you by your health care provider. Make sure you discuss any questions you have with your health care provider. ° °Enoxaparin injection °What is this medicine? °ENOXAPARIN (ee nox a PA rin) is used after knee, hip, or abdominal surgeries to prevent blood clotting. It is also used to treat existing blood clots in the lungs or in the veins. °This  medicine may be used for other purposes; ask your health care provider or pharmacist if you have questions. °COMMON BRAND NAME(S): Lovenox °What should I tell my health care provider before I take this medicine? °They need to know if you have any of these conditions: °-bleeding disorders, hemorrhage, or hemophilia °-infection of the heart or heart valves °-kidney or liver disease °-previous stroke °-prosthetic heart valve °-recent surgery or delivery of a baby °-ulcer in the stomach or intestine, diverticulitis, or other bowel disease °-an unusual or allergic reaction to enoxaparin, heparin, pork or pork products, other medicines, foods, dyes, or preservatives °-  pregnant or trying to get pregnant °-breast-feeding °How should I use this medicine? °This medicine is for injection under the skin. It is usually given by a health-care professional. You or a family member may be trained on how to give the injections. If you are to give yourself injections, make sure you understand how to use the syringe, measure the dose if necessary, and give the injection. To avoid bruising, do not rub the site where this medicine has been injected. Do not take your medicine more often than directed. Do not stop taking except on the advice of your doctor or health care professional. °Make sure you receive a puncture-resistant container to dispose of the needles and syringes once you have finished with them. Do not reuse these items. Return the container to your doctor or health care professional for proper disposal. °Talk to your pediatrician regarding the use of this medicine in children. Special care may be needed. °Overdosage: If you think you have taken too much of this medicine contact a poison control center or emergency room at once. °NOTE: This medicine is only for you. Do not share this medicine with others. °What if I miss a dose? °If you miss a dose, take it as soon as you can. If it is almost time for your next dose, take  only that dose. Do not take double or extra doses. °What may interact with this medicine? °-aspirin and aspirin-like medicines °-certain medicines that treat or prevent blood clots °-dipyridamole °-NSAIDs, medicines for pain and inflammation, like ibuprofen or naproxen °This list may not describe all possible interactions. Give your health care provider a list of all the medicines, herbs, non-prescription drugs, or dietary supplements you use. Also tell them if you smoke, drink alcohol, or use illegal drugs. Some items may interact with your medicine. °What should I watch for while using this medicine? °Visit your healthcare professional for regular checks on your progress. You may need blood work done while you are taking this medicine. Your condition will be monitored carefully while you are receiving this medicine. It is important not to miss any appointments. °If you are going to need surgery or other procedure, tell your healthcare professional that you are using this medicine. °Using this medicine for a long time may weaken your bones and increase the risk of bone fractures. °Avoid sports and activities that might cause injury while you are using this medicine. Severe falls or injuries can cause unseen bleeding. Be careful when using sharp tools or knives. Consider using an electric razor. Take special care brushing or flossing your teeth. Report any injuries, bruising, or red spots on the skin to your healthcare professional. °Wear a medical ID bracelet or chain. Carry a card that describes your disease and details of your medicine and dosage times. °What side effects may I notice from receiving this medicine? °Side effects that you should report to your doctor or health care professional as soon as possible: °-allergic reactions like skin rash, itching or hives, swelling of the face, lips, or tongue °-bone pain °-signs and symptoms of bleeding such as bloody or black, tarry stools; red or dark-brown urine;  spitting up blood or brown material that looks like coffee grounds; red spots on the skin; unusual bruising or bleeding from the eye, gums, or nose °-signs and symptoms of a blood clot such as chest pain; shortness of breath; pain, swelling, or warmth in the leg °-signs and symptoms of a stroke such as changes in vision; confusion; trouble   speaking or understanding; severe headaches; sudden numbness or weakness of the face, arm or leg; trouble walking; dizziness; loss of coordination °Side effects that usually do not require medical attention (report to your doctor or health care professional if they continue or are bothersome): °-hair loss °-pain, redness, or irritation at site where injected °This list may not describe all possible side effects. Call your doctor for medical advice about side effects. You may report side effects to FDA at 1-800-FDA-1088. °Where should I keep my medicine? °Keep out of the reach of children. °Store at room temperature between 15 and 30 degrees C (59 and 86 degrees F). Do not freeze. If your injections have been specially prepared, you may need to store them in the refrigerator. Ask your pharmacist. Throw away any unused medicine after the expiration date. °NOTE: This sheet is a summary. It may not cover all possible information. If you have questions about this medicine, talk to your doctor, pharmacist, or health care provider. °

## 2019-05-08 NOTE — Evaluation (Signed)
Physical Therapy Evaluation Patient Details Name: [name] MRN: [id number] DOB: 1975/06/27 Today's Date: 05/08/2019   History of Present Illness  Pt is a 44 y/o female s/p L4-5 ALIF. PMH includes back surgery.   Clinical Impression  Patient is s/p above surgery resulting in the deficits listed below (see PT Problem List). Pt limited in mobility tolerance secondary to pain. Pt with L knee buckling at times, requiring occasional min A. Educated about back precautions, walking program, and use of RW at home to improve safety. Patient will benefit from skilled PT to increase their independence and safety with mobility (while adhering to their precautions) to allow discharge to the venue listed below.     Follow Up Recommendations No PT follow up    Equipment Recommendations  Rolling walker with 5" wheels;3in1 (PT)    Recommendations for Other Services       Precautions / Restrictions Precautions Precautions: Back Precaution Booklet Issued: Yes (comment) Precaution Comments: Reviewed back precautions with pt.  Required Braces or Orthoses: Spinal Brace Spinal Brace: Lumbar corset;Applied in sitting position Restrictions Weight Bearing Restrictions: No      Mobility  Bed Mobility Overal bed mobility: Needs Assistance Bed Mobility: Sidelying to Sit;Rolling;Sit to Sidelying Rolling: Supervision Sidelying to sit: Min assist     Sit to sidelying: Min assist General bed mobility comments: Min A for trunk elevation to come to sitting from sidelying. Min A for LE assist for return to supine. Cues to use log roll technique.   Transfers Overall transfer level: Needs assistance Equipment used: None Transfers: Sit to/from Stand Sit to Stand: Min assist         General transfer comment: Min A for lift assist and steadying without AD. Had RN bring in RW as pt with very flexed posture in standing and holding to IV pole.   Ambulation/Gait Ambulation/Gait assistance: Min  assist;Min guard Gait Distance (Feet): 50 Feet Assistive device: Rolling walker (2 wheeled) Gait Pattern/deviations: Step-to pattern;Decreased step length - right;Decreased step length - left;Decreased weight shift to left;Antalgic Gait velocity: Decreased   General Gait Details: Occasional min A for steadying secondary to L knee buckling during gait. Cues for upright posture and proximity to device. Educated about walking program to perform at home. Educated about use of RW at home to improve safety.   Stairs            Wheelchair Mobility    Modified Rankin (Stroke Patients Only)       Balance Overall balance assessment: Needs assistance Sitting-balance support: No upper extremity supported;Feet supported Sitting balance-Leahy Scale: Fair     Standing balance support: Bilateral upper extremity supported;During functional activity Standing balance-Leahy Scale: Poor Standing balance comment: Heavy reliance on UE support                              Pertinent Vitals/Pain Pain Assessment: Faces Faces Pain Scale: Hurts whole lot Pain Location: LLE and abdomen at incision site Pain Descriptors / Indicators: Aching;Operative site guarding Pain Intervention(s): Limited activity within patient's tolerance;Monitored during session;Repositioned    Home Living Family/patient expects to be discharged to:: Private residence Living Arrangements: Children;Other relatives Available Help at Discharge: Family;Available 24 hours/day Type of Home: House Home Access: Stairs to enter Entrance Stairs-Rails: Left Entrance Stairs-Number of Steps: 5 Home Layout: One level Home Equipment: Shower seat - built in      Prior Function Level of Independence: Independent  Hand Dominance        Extremity/Trunk Assessment   Upper Extremity Assessment Upper Extremity Assessment: Defer to OT evaluation    Lower Extremity Assessment Lower Extremity  Assessment: LLE deficits/detail LLE Deficits / Details: LLE pain at baseline. Pt reports feels worse and did have some LLE buckling during gait.     Cervical / Trunk Assessment Cervical / Trunk Assessment: Other exceptions Cervical / Trunk Exceptions: s/p ALIF   Communication   Communication: No difficulties  Cognition Arousal/Alertness: Awake/alert Behavior During Therapy: WFL for tasks assessed/performed Overall Cognitive Status: Within Functional Limits for tasks assessed                                        General Comments      Exercises     Assessment/Plan    PT Assessment Patient needs continued PT services  PT Problem List Decreased strength;Decreased balance;Decreased activity tolerance;Decreased mobility;Decreased knowledge of use of DME;Decreased knowledge of precautions;Pain       PT Treatment Interventions Gait training;Functional mobility training;Stair training;DME instruction;Therapeutic activities;Therapeutic exercise;Balance training;Patient/family education    PT Goals (Current goals can be found in the Care Plan section)  Acute Rehab PT Goals Patient Stated Goal: to go home PT Goal Formulation: With patient Time For Goal Achievement: 05/22/19 Potential to Achieve Goals: Good    Frequency Min 5X/week   Barriers to discharge        Co-evaluation               AM-PAC PT "6 Clicks" Mobility  Outcome Measure Help needed turning from your back to your side while in a flat bed without using bedrails?: A Little Help needed moving from lying on your back to sitting on the side of a flat bed without using bedrails?: A Little Help needed moving to and from a bed to a chair (including a wheelchair)?: A Little Help needed standing up from a chair using your arms (e.g., wheelchair or bedside chair)?: A Little Help needed to walk in hospital room?: A Little Help needed climbing 3-5 steps with a railing? : A Lot 6 Click Score: 17     End of Session Equipment Utilized During Treatment: Gait belt;Back brace Activity Tolerance: Patient limited by pain Patient left: in bed;with call bell/phone within reach Nurse Communication: Mobility status PT Visit Diagnosis: Unsteadiness on feet (R26.81);Muscle weakness (generalized) (M62.81)    Time: 7858-8502 PT Time Calculation (min) (ACUTE ONLY): 26 min   Charges:   PT Evaluation $PT Eval Low Complexity: 1 Low PT Treatments $Gait Training: 8-22 mins        Lou Miner, DPT  Acute Rehabilitation Services  Pager: [phone] Office: [phone]   Rudean Hitt 05/08/2019, 5:48 PM

## 2019-05-08 NOTE — OR Nursing (Signed)
Per protocol Dr. Chilton Si called at 12:29 ON 05/08/2019 and confirmed there was no instrumentation in picture.

## 2019-05-08 NOTE — Op Note (Signed)
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  Postoperative diagnosis: Same  Operative procedure: Anterior lumbar interbody fusion L4-5  Complications: None  Approach surgeon: Dr. Gretta Began  First Assistant: Glynis Smiles, PA  Implants: RSB peek intervertebral cage: 36 x 16 x 8 degree lordosis.  Anterior lumbar plate 0 profile 36 x 16 x 8 degree lordosis.  5.0 x 25 mm length locking screws x3.    Allograft:  vivogen  Indications: [name] is a very pleasant 43 who is had a previous L4-5 discectomy due to a disc herniation.  He initially did well and then unfortunately had a repeat injury and started having increasing back pain.  Despite appropriate conservative management her quality of life is continued to deteriorate.  Imaging studies demonstrated degenerative lumbar disc disease and we confirmed the pain generators with discography.  After discussing treatment options she elected to move forward with the fusion.  All appropriate risks benefits and alternatives were discussed with the patient and consent was obtained.  Operative report: Patient was brought the operating placed on the operating room table.  After successful induction of general anesthesia endotracheal ovation teds SCDs and a Foley were inserted.  Timeout was taken to confirm patient procedure and all other important data.  Using fluoroscopy identified the L4-5 disc space in the AP and lateral planes and marked out the incision site.  At this point time Dr. Arbie Cookey performed a standard retroperitoneal approach to the lumbar spine.  Please refer to his dictation for specifics on the approach.  Once the retracting system was in place and the iliac vessels were properly retracted and the disc space exposed a needle was placed into the L4-5 disc space and fluoroscopic image was taken.  This confirmed that we are at the appropriate level.  At this point  Dr. Arbie Cookey scrubbed out and I continued with the surgery.  An annulotomy was performed with a 10 blade scalpel and then using pituitary rongeurs I remove the bulk of the disc material I then used curettes to  remove the cartilaginous endplate and remaining portion of disc.  I continue to work until I was down to the posterior aspect of the disc space.  I then placed a distractor on one side of the disc space and then used a fine angled nerve hook to release the annulus from the posterior aspect of the vertebral body of L5.  This allowed me to place my 2 mm Kerrison rongeur behind the vertebral body and resect the posterior osteophyte.  I then used a fine nerve hook to sweep the posterior annulus and resected.  Once I had removed the entire disc material and confirmed to have bleeding subchondral bone.  I was able to freely pass my nerve hook behind the vertebral body of L5 and L4 and confirmed this with fluoroscopy.  Under live fluoroscopy I distracted the intervertebral space and confirmed that had parallel endplate distraction  With the skin ectomy complete I then began using the trial intervertebral spacers.  I ultimately settled on using a size 16 extra-large.  This provided the best overall fit into the disc space.  At this point the RSB interbody peek cage was packed with the allograft and then malleted to the appropriate depth.  The 0 profile plate was then secured over the RSB cage.  I made sure that the cage itself was countersunk below the level of the disc space.  With the intervertebral  spacer properly positioned I then used an awl to broach the cortex and then placed 2 screws into the L4 vertebral body and a single screw into the L5 vertebral body.  All screws had excellent purchase and were secure.  I then placed the locking cap to prevent the screws from backing out.  This locking cap was torqued off and tightened according to manufacture standards.  At this point I irrigated the wound copiously with  normal saline and made sure that hemostasis using bipolar electrocautery and FloSeal.  I remove the retractors sequentially and there was no active bleeding.  I then took final fluoroscopy views and I confirmed in both the AP and lateral planes satisfactory positioning of the implant and the intervertebral cage.  All 3 screws were properly positioned.  The fascia of the rectus was then reapproximated with a running #1 PDS suture.  I then closed the remainder in a layered fashion with interrupted #1 Vicryl suture, 2-0 Vicryl suture, and 3-0 Monocryl for the skin.  Final AP and lateral intraoperative digital films were taken confirming no retained surgical implants instruments in the wound and satisfactory positioning of the spinal implant.  Sterile dressing was then applied and the patient was ultimately extubated transfer the PACU without incident.  The end of the case all needle sponge counts were correct.  There were no adverse intraoperative events.

## 2019-05-09 ENCOUNTER — Encounter: Payer: Self-pay | Admitting: *Deleted

## 2019-05-09 ENCOUNTER — Inpatient Hospital Stay (HOSPITAL_COMMUNITY): Payer: No Typology Code available for payment source

## 2019-05-09 DIAGNOSIS — Z981 Arthrodesis status: Secondary | ICD-10-CM

## 2019-05-09 MED FILL — Sodium Chloride IV Soln 0.9%: INTRAVENOUS | Qty: 1000 | Status: AC

## 2019-05-09 MED FILL — Heparin Sodium (Porcine) Inj 1000 Unit/ML: INTRAMUSCULAR | Qty: 30 | Status: AC

## 2019-05-09 NOTE — Progress Notes (Signed)
Physical Therapy Treatment Patient Details Name: [name] MRN: [id number] DOB: [birth year] Today's Date: 05/09/2019    History of Present Illness Pt is a 44 y/o female s/p L4-5 ALIF. PMH includes back surgery.     PT Comments    Pt mobilizing well this session, and ambulated with no buckling of LEs. Pt is aware of her LE weakness L>R, and is able to utilize RW appropriately to support safe mobility. Pt also navigated steps proficiently, PT providing education on safe stair navigation and how her sister can assist her with steps. Pt with good knowledge and application of back precautions as well. Pt eager to d/c home today, no further acute PT needs.    Follow Up Recommendations  No PT follow up     Equipment Recommendations  Rolling walker with 5" wheels;3in1 (PT)    Recommendations for Other Services       Precautions / Restrictions Precautions Precautions: Back Precaution Booklet Issued: Yes (comment) Precaution Comments: Reviewed back precautions with pt - pt able to state 3/3 precautions Required Braces or Orthoses: Spinal Brace Spinal Brace: Lumbar corset;Applied in sitting position Restrictions Weight Bearing Restrictions: No    Mobility  Bed Mobility Overal bed mobility: Modified Independent Bed Mobility: Sidelying to Sit;Rolling;Sit to Sidelying Rolling: Supervision Sidelying to sit: Supervision     Sit to sidelying: Min assist General bed mobility comments: supervision for safety for coming to EOB, increased time and encouraged no use of bedrails as pt does not have them at home. Min assist for leg lifting back into bed, pt limited by very sore abdomen  Transfers Overall transfer level: Needs assistance Equipment used: Rolling walker (2 wheeled) Transfers: Sit to/from Stand Sit to Stand: Supervision         General transfer comment: supervision for safety, increased time to rise and steady self.  Ambulation/Gait Ambulation/Gait assistance: Min  guard Gait Distance (Feet): 125 Feet Assistive device: Rolling walker (2 wheeled) Gait Pattern/deviations: Step-through pattern;Decreased stride length;Trunk flexed;Decreased dorsiflexion - left Gait velocity: decr   General Gait Details: min guard to supervision for safety, pt with LE weakness L>R during gait noted in semi-flexed position of knees but no true buckling. PT encouraged pt to use RW to take weight off LEs, LLE especially, if feeling particularly weak. Verbal cuing for placement in RW, upright posture.   Stairs Stairs: Yes Stairs assistance: Min guard Stair Management: One rail Left;Step to pattern;Forwards Number of Stairs: 10 General stair comments: min guard for safety, verbal cuing for taking her time, use of handrail, sequencing (ascend with stronger R leg leading, descend with weaker L leg leading). HHA provided on descending as pt more nervous and experiencing abdominal pain. PT educated pt on having sister stand posterior to her with ascending steps, and anterolateral to her descending.   Wheelchair Mobility    Modified Rankin (Stroke Patients Only)       Balance Overall balance assessment: Needs assistance Sitting-balance support: No upper extremity supported;Feet supported Sitting balance-Leahy Scale: Good     Standing balance support: Bilateral upper extremity supported;During functional activity Standing balance-Leahy Scale: Fair Standing balance comment: does better with UE support, but able to release for ADLs with OT or adjusting brace in standing                            Cognition Arousal/Alertness: Awake/alert Behavior During Therapy: WFL for tasks assessed/performed Overall Cognitive Status: Within Functional Limits for tasks assessed  Exercises Other Exercises Other Exercises: home walking program - up and walking 1x/hour while awake to promote circulation and prevent back  stiffness/postural fatigue.    General Comments General comments (skin integrity, edema, etc.): Educated on increasing actrivity tolerance adn building in times during the day to rest her back in sidelying/supine      Pertinent Vitals/Pain Pain Assessment: Faces Pain Score: 4  Faces Pain Scale: Hurts even more Pain Location: abdomen at incision site Pain Descriptors / Indicators: Aching;Operative site guarding;Sore Pain Intervention(s): Limited activity within patient's tolerance;Monitored during session    Home Living Family/patient expects to be discharged to:: Private residence Living Arrangements: Children;Other relatives Available Help at Discharge: Family;Available 24 hours/day Type of Home: House Home Access: Stairs to enter Entrance Stairs-Rails: Left Home Layout: One level Home Equipment: Shower seat - built in      Prior Function Level of Independence: Independent          PT Goals (current goals can now be found in the care plan section) Acute Rehab PT Goals Patient Stated Goal: to go home PT Goal Formulation: With patient Time For Goal Achievement: 05/22/19 Potential to Achieve Goals: Good Progress towards PT goals: Progressing toward goals    Frequency    Min 5X/week      PT Plan Current plan remains appropriate    Co-evaluation              AM-PAC PT "6 Clicks" Mobility   Outcome Measure  Help needed turning from your back to your side while in a flat bed without using bedrails?: None Help needed moving from lying on your back to sitting on the side of a flat bed without using bedrails?: None Help needed moving to and from a bed to a chair (including a wheelchair)?: A Little Help needed standing up from a chair using your arms (e.g., wheelchair or bedside chair)?: A Little Help needed to walk in hospital room?: A Little Help needed climbing 3-5 steps with a railing? : A Little 6 Click Score: 20    End of Session Equipment Utilized  During Treatment: Gait belt;Back brace Activity Tolerance: Patient tolerated treatment well Patient left: in bed;with call bell/phone within reach Nurse Communication: Mobility status PT Visit Diagnosis: Unsteadiness on feet (R26.81);Muscle weakness (generalized) (M62.81)     Time: 1937-9024 PT Time Calculation (min) (ACUTE ONLY): 19 min  Charges:  $Gait Training: 8-22 mins                     Ceirra Belli E, [place] Pager [phone]  Office [phone]  [place] 05/09/2019, 2:00 PM

## 2019-05-09 NOTE — Progress Notes (Signed)
Occupational Therapy Evaluation Patient Details Name: [name] MRN: [id number] DOB: May 01, 1975 Today's Date: 05/09/2019    History of Present Illness Pt is a 44 y/o female s/p L4-5 ALIF. PMH includes back surgery.    Clinical Impression   Completed all education regarding compensatory strategies and use of AE/DME for ADL and functional mobility to adhere to back precautions. Pt able to return demonstrate. Handout provided and reviewed.No further OT needed.     Follow Up Recommendations  No OT follow up;Supervision - Intermittent    Equipment Recommendations  3 in 1 bedside commode;Other (comment)(RW)    Recommendations for Other Services       Precautions / Restrictions Precautions Precautions: Back Precaution Booklet Issued: Yes (comment) Precaution Comments: Reviewed back precautions with pt.  Required Braces or Orthoses: Spinal Brace Spinal Brace: Lumbar corset;Applied in sitting position Restrictions Weight Bearing Restrictions: No      Mobility Bed Mobility Overal bed mobility: Modified Independent                Transfers Overall transfer level: Needs assistance Equipment used: Rolling walker (2 wheeled) Transfers: Sit to/from Stand Sit to Stand: Supervision         General transfer comment: VC for correct hand placement    Balance Overall balance assessment: Needs assistance Sitting-balance support: No upper extremity supported;Feet supported Sitting balance-Leahy Scale: Good     Standing balance support: Bilateral upper extremity supported;During functional activity Standing balance-Leahy Scale: Fair Standing balance comment: does better with UE support, but able to release for ADL                           ADL either performed or assessed with clinical judgement   ADL Overall ADL's : Needs assistance/impaired       Grooming Details (indicate cue type and reason): Educated on compensatory strategies                              Functional mobility during ADLs: Supervision/safety;Rolling walker;Cueing for safety General ADL Comments: Educated pt on compensatory strategies and use of AE/DME for ADL. Pt able to complete figure four position to complete LB ADL. Recommend use of shower chair for bathing. Pt has built in seat and doesnot feel there is enough room for the 3in1. Educated on importance od not trying to sit at low level at this time until pt becomes stronger. Pt not allowed to bath for 5 days per MD. REcommend long hnadled sponge and reacher. Pt has difficulty reaching periarea - educated on use of toilet tongs if needed. Pt also asking about time to wait until able to engage in sex - pamphlet provided; recommend pt discuss with her MD first.      Vision         Perception     Praxis      Pertinent Vitals/Pain Pain Assessment: 0-10 Pain Score: 4  Pain Location: LLE and abdomen at incision site Pain Descriptors / Indicators: Aching;Operative site guarding;Sore Pain Intervention(s): Limited activity within patient's tolerance     Hand Dominance Right   Extremity/Trunk Assessment Upper Extremity Assessment Upper Extremity Assessment: Overall WFL for tasks assessed   Lower Extremity Assessment Lower Extremity Assessment: Defer to PT evaluation LLE Deficits / Details: LLE pain at baseline. Pt reports feels worse and did have some LLE buckling during gait.    Cervical / Trunk Assessment Cervical / Trunk  Assessment: Other exceptions Cervical / Trunk Exceptions: s/p ALIF    Communication Communication Communication: No difficulties   Cognition Arousal/Alertness: Awake/alert Behavior During Therapy: WFL for tasks assessed/performed Overall Cognitive Status: Within Functional Limits for tasks assessed                                     General Comments  Educated on increasing actrivity tolerance adn building in times during the day to rest her back in  sidelying/supine    Exercises     Shoulder Instructions      Home Living Family/patient expects to be discharged to:: Private residence Living Arrangements: Children;Other relatives Available Help at Discharge: Family;Available 24 hours/day Type of Home: House Home Access: Stairs to enter [employer] of Steps: 5 Entrance Stairs-Rails: Left Home Layout: One level     Bathroom Shower/Tub: Producer, television/film/video: Standard Bathroom Accessibility: Yes How Accessible: Accessible via walker Home Equipment: Shower seat - built in          Prior Functioning/Environment Level of Independence: Independent                 OT Problem List: Decreased knowledge of use of DME or AE;Decreased knowledge of precautions;Pain      OT Treatment/Interventions:      OT Goals(Current goals can be found in the care plan section) Acute Rehab OT Goals Patient Stated Goal: to go home OT Goal Formulation: All assessment and education complete, DC therapy  OT Frequency:     Barriers to D/C:            Co-evaluation              AM-PAC OT "6 Clicks" Daily Activity     Outcome Measure Help from another person eating meals?: None Help from another person taking care of personal grooming?: A Little Help from another person toileting, which includes using toliet, bedpan, or urinal?: A Little Help from another person bathing (including washing, rinsing, drying)?: A Little Help from another person to put on and taking off regular upper body clothing?: A Little Help from another person to put on and taking off regular lower body clothing?: A Little 6 Click Score: 19   End of Session Equipment Utilized During Treatment: Rolling walker;Back brace Nurse Communication: Mobility status  Activity Tolerance: Patient tolerated treatment well Patient left: in bed;with call bell/phone within reach  OT Visit Diagnosis: Pain Pain - part of body: (back)                 Time: 4128-7867 OT Time Calculation (min): 35 min Charges:  OT General Charges $OT Visit: 1 Visit OT Evaluation $OT Eval Low Complexity: 1 Low OT Treatments $Self Care/Home Management : 8-22 mins  Luisa Dago, OT/L   Acute OT Clinical Specialist Acute Rehabilitation Services Pager [phone] Office [phone]   [facility] 05/09/2019, 11:56 AM

## 2019-05-09 NOTE — Progress Notes (Signed)
    Subjective: Procedure(s) (LRB): ANTERIOR LUMBAR FUSION LUMBAR FOUR- LUMBAR FIVE (N/A) ABDOMINAL EXPOSURE (N/A) 1 Day Post-Op  Patient reports pain as 3 on 0-10 scale.  Reports decreased leg pain reports incisional back pain   Positive void Negative bowel movement Positive flatus Negative chest pain or shortness of breath  Objective: Vital signs in last 24 hours: Temp:  [97.7 F (36.5 C)-98.7 F (37.1 C)] 98.7 F (37.1 C) (03/18 0343) Pulse Rate:  [75-110] 80 (03/18 0343) Resp:  [11-22] 18 (03/18 0343) BP: (133-153)/(69-93) 133/80 (03/18 0343) SpO2:  [93 %-100 %] 100 % (03/18 0343)  Intake/Output from previous day: 03/17 0701 - 03/18 0700 In: 2120 [P.O.:120; I.V.:1800; IV Piggyback:200] Out: 250 [Urine:100; Blood:100]  Labs: Recent Labs    05/06/19 1404 05/08/19 1408  WBC 6.0 11.0*  RBC 4.18 4.05  HCT 43.0 40.8  PLT 272 288   Recent Labs    05/06/19 1404 05/08/19 1408  NA 138  --   K 3.7  --   CL 106  --   CO2 25  --   BUN 8  --   CREATININE 0.90 1.06*  GLUCOSE 86  --   CALCIUM 9.1  --    No results for input(s): LABPT, INR in the last 72 hours.  Physical Exam: Neurologically intact ABD soft Intact pulses distally Incision: dressing C/D/I and no drainage Compartment soft left groin/thigh pain.  No focal motor deficits on exam.  Negative st. leg raise test Body mass index is 31.38 kg/m.   Assessment/Plan: Patient stable  xrays satisfactory placement of cage and plate Continue mobilization with physical therapy Continue care  Up with therapy  Patient status post anterior lumbar interbody fusion L4-5.  Ongoing studies are satisfactory.  Patient is experiencing some anterior left thigh pain which is to be expected given the retraction on the psoas muscle.  This has improved since surgery and she is ambulated without significant pain.  Overall I am pleased with her recovery and expected to continue to make improvements.  We will plan on  discharging home  F/u in 2 weeks for wound check   Venita Lick, MD [facility] [phone]

## 2019-05-09 NOTE — Progress Notes (Signed)
Lower extremity venous has been completed.   Preliminary results in CV Proc.   Blanch Media 05/09/2019 1:48 PM

## 2019-05-09 NOTE — Progress Notes (Signed)
Patient is discharged from room 3C04 at this time. Alert and in stable condition. IV sit d/c'd and instructions read to patient with understanding verbalized. Left unit via wheelchair with all belongings at side.

## 2019-05-09 NOTE — Progress Notes (Signed)
Patient ID: [name], female   DOB: 04-27-1975, 44 y.o.   MRN: [id number] Comfortable this morning Sitting up eating breakfast.  Has been walking in the hall. Soft and nontender.  2+ dorsalis pedis pulses bilaterally Charge home today per Dr. Shon Baton

## 2019-05-13 NOTE — Discharge Summary (Signed)
Patient ID: [name] MRN: [id number] DOB/AGE: May 12, 1975 44 y.o.  Admit date: 05/08/2019 Discharge date: 05/13/2019  Admission Diagnoses:  Active Problems:   S/P lumbar fusion   Discharge Diagnoses:  Active Problems:   S/P lumbar fusion  status post Procedure(s): ANTERIOR LUMBAR FUSION LUMBAR FOUR- LUMBAR FIVE ABDOMINAL EXPOSURE  Past Medical History:  Diagnosis Date  . Acute low back pain   . Degeneration of lumbar intervertebral disc   . Family history of breast cancer   . Family history of melanoma   . History of lumbar discectomy   . Lumbar post-laminectomy syndrome     Surgeries: Procedure(s): ANTERIOR LUMBAR FUSION LUMBAR FOUR- LUMBAR FIVE ABDOMINAL EXPOSURE on 05/08/2019   Consultants:   Discharged Condition: Improved  Hospital Course: [name] is an 44 y.o. female who was admitted 05/08/2019 for operative treatment of Post laminectomy syndrome, degenerative disc disease L4-5. Patient failed conservative treatments (please see the history and physical for the specifics) and had severe unremitting pain that affects sleep, daily activities and work/hobbies. After pre-op clearance, the patient was taken to the operating room on 05/08/2019 and underwent  Procedure(s): [place].    Patient was given perioperative antibiotics:  Anti-infectives (From admission, onward)   Start     Dose/Rate Route Frequency Ordered Stop   05/08/19 1630  ceFAZolin (ANCEF) IVPB 1 g/50 mL premix     1 g 100 mL/hr over 30 Minutes Intravenous Every 8 hours 05/08/19 1355 05/09/19 0829   05/08/19 0652  ceFAZolin (ANCEF) 2-4 GM/100ML-% IVPB    Note to Pharmacy: Granville Lewis, Lindsi   : cabinet override      05/08/19 0652 05/08/19 1005   05/08/19 0647  ceFAZolin (ANCEF) IVPB 2g/100 mL premix     2 g 200 mL/hr over 30 Minutes Intravenous 30 min pre-op 05/08/19 0647 05/08/19 0845       Patient was given sequential compression  devices and early ambulation to prevent DVT.   Patient benefited maximally from hospital stay and there were no complications. At the time of discharge, the patient was urinating/moving their bowels without difficulty, tolerating a regular diet, pain is controlled with oral pain medications and they have been cleared by PT/OT.   Recent vital signs: No data found.   Recent laboratory studies: No results for input(s): WBC, HGB, HCT, PLT, NA, K, CL, CO2, BUN, CREATININE, GLUCOSE, INR, CALCIUM in the last 72 hours.  Invalid input(s): PT, 2   Discharge Medications:   Allergies as of 05/09/2019   No Known Allergies     Medication List    TAKE these medications   enoxaparin 40 MG/0.4ML injection Commonly known as: LOVENOX Inject 0.4 mLs (40 mg total) into the skin daily for 10 doses. 10 day supply 1 injection per day   fluticasone 50 MCG/ACT nasal spray Commonly known as: FLONASE Place 2 sprays into both nostrils daily as needed for allergies.   gabapentin 300 MG capsule Commonly known as: Neurontin Take 1 capsule (300 mg total) by mouth 3 (three) times daily as needed for up to 14 days.   levonorgestrel 20 MCG/24HR IUD Commonly known as: MIRENA 1 each by Intrauterine route once.   methocarbamol 500 MG tablet Commonly known as: Robaxin Take 1 tablet (500 mg total) by mouth every 8 (eight) hours as needed for up to 5 days for muscle spasms.   ondansetron 4 MG tablet Commonly known as: Zofran Take 1 tablet (4 mg total) by  mouth every 8 (eight) hours as needed for nausea or vomiting.   oxyCODONE-acetaminophen 10-325 MG tablet Commonly known as: Percocet Take 1 tablet by mouth every 6 (six) hours as needed for up to 5 days for pain.       Diagnostic Studies: DG Chest 2 View  Result Date: 05/06/2019 CLINICAL DATA:  Pre-op respiratory exam for lumbar spine surgery. EXAM: CHEST - 2 VIEW COMPARISON:  None. FINDINGS: The heart size and mediastinal contours are within normal  limits. Both lungs are clear. The visualized skeletal structures are unremarkable. IMPRESSION: No active cardiopulmonary disease. Electronically Signed   By: Danae Orleans M.D.   On: 05/06/2019 17:22   DG C-Arm 1-60 Min-No Report  Result Date: 05/08/2019 Fluoroscopy was utilized by the requesting physician.  No radiographic interpretation.   VAS [place] LOWER EXTREMITY VENOUS (DVT)  Result Date: 05/09/2019  Lower Venous DVTStudy Indications: S/p ALIF.  Comparison Study: no prior Performing Technologist: Blanch Media RVS  Examination Guidelines: A complete evaluation includes B-mode imaging, spectral Doppler, color Doppler, and power Doppler as needed of all accessible portions of each vessel. Bilateral testing is considered an integral part of a complete examination. Limited examinations for reoccurring indications may be performed as noted. The reflux portion of the exam is performed with the patient in reverse Trendelenburg.  +---------+---------------+---------+-----------+----------+--------------+ RIGHT    CompressibilityPhasicitySpontaneityPropertiesThrombus Aging +---------+---------------+---------+-----------+----------+--------------+ CFV      Full           Yes      Yes                                 +---------+---------------+---------+-----------+----------+--------------+ SFJ      Full                                                        +---------+---------------+---------+-----------+----------+--------------+ FV Prox  Full                                                        +---------+---------------+---------+-----------+----------+--------------+ FV Mid   Full                                                        +---------+---------------+---------+-----------+----------+--------------+ FV DistalFull                                                        +---------+---------------+---------+-----------+----------+--------------+ PFV      Full                                                         +---------+---------------+---------+-----------+----------+--------------+ POP  Full           Yes      Yes                                 +---------+---------------+---------+-----------+----------+--------------+ PTV      Full                                                        +---------+---------------+---------+-----------+----------+--------------+ PERO     Full                                                        +---------+---------------+---------+-----------+----------+--------------+   +---------+---------------+---------+-----------+----------+--------------+ LEFT     CompressibilityPhasicitySpontaneityPropertiesThrombus Aging +---------+---------------+---------+-----------+----------+--------------+ CFV      Full           Yes      Yes                                 +---------+---------------+---------+-----------+----------+--------------+ SFJ      Full                                                        +---------+---------------+---------+-----------+----------+--------------+ FV Prox  Full                                                        +---------+---------------+---------+-----------+----------+--------------+ FV Mid   Full                                                        +---------+---------------+---------+-----------+----------+--------------+ FV DistalFull                                                        +---------+---------------+---------+-----------+----------+--------------+ PFV      Full                                                        +---------+---------------+---------+-----------+----------+--------------+ POP      Full           Yes      Yes                                 +---------+---------------+---------+-----------+----------+--------------+  PTV      Full                                                         +---------+---------------+---------+-----------+----------+--------------+     Summary: BILATERAL: - No evidence of deep vein thrombosis seen in the lower extremities, bilaterally.   *See table(s) above for measurements and observations. Electronically signed by Lemar Livings MD on 05/09/2019 at 4:53:23 PM.    Final    DG OR LOCAL ABDOMEN  Result Date: 05/08/2019 CLINICAL DATA:  Status post surgical anterior lumbar fusion. EXAM: OR LOCAL ABDOMEN COMPARISON:  None. FINDINGS: The bowel gas pattern is normal. No radio-opaque calculi or other significant radiographic abnormality are seen. Status post surgical anterior fusion of L4-5. Intrauterine device is noted in the pelvis. Surgical clips are seen in the left side of the pelvis. No other definite radiopaque foreign body is noted. IMPRESSION: Intrauterine device is noted. Status post surgical anterior fusion of L4-5. Surgical clip seen in left-sided pelvis. No other radiopaque foreign body is noted. These results were called by telephone at the time of interpretation on 05/08/2019 at 12:30 pm to provider Annika in OR 4, who verbally acknowledged these results. Electronically Signed   By: Lupita Raider M.D.   On: 05/08/2019 12:30    Discharge Instructions    Incentive spirometry RT   Complete by: As directed       Follow-up Information    Venita Lick, MD. Schedule an appointment as soon as possible for a visit in 2 weeks.   Specialty: Orthopedic Surgery Why: If symptoms worsen, For suture removal, For wound re-check Contact information: [address] [place] [address] [phone]           Discharge Plan:  discharge to home  Disposition: stable    Signed: Leonette Monarch Jakobie Henslee for [facility] PA-C [facility] [phone] 05/13/2019, 8:22 AM

## 2020-04-21 HISTORY — PX: LUMBAR FUSION: SHX111

## 2020-08-17 ENCOUNTER — Other Ambulatory Visit: Payer: Self-pay

## 2020-08-17 ENCOUNTER — Other Ambulatory Visit (HOSPITAL_COMMUNITY)
Admission: RE | Admit: 2020-08-17 | Discharge: 2020-08-17 | Disposition: A | Discharge status: Home / Self Care | Payer: BC Managed Care – PPO | Source: Ambulatory Visit | Attending: Obstetrics and Gynecology | Admitting: Obstetrics and Gynecology

## 2020-08-17 ENCOUNTER — Encounter: Payer: Self-pay | Admitting: Obstetrics and Gynecology

## 2020-08-17 ENCOUNTER — Ambulatory Visit (INDEPENDENT_AMBULATORY_CARE_PROVIDER_SITE_OTHER): Payer: BC Managed Care – PPO | Admitting: Obstetrics and Gynecology

## 2020-08-17 ENCOUNTER — Other Ambulatory Visit: Payer: Self-pay | Admitting: Obstetrics and Gynecology

## 2020-08-17 VITALS — BP 138/82 | HR 74 | Ht 68.0 in | Wt 212.0 lb

## 2020-08-17 DIAGNOSIS — Z01419 Encounter for gynecological examination (general) (routine) without abnormal findings: Secondary | ICD-10-CM

## 2020-08-17 DIAGNOSIS — D582 Other hemoglobinopathies: Secondary | ICD-10-CM

## 2020-08-17 DIAGNOSIS — Z1231 Encounter for screening mammogram for malignant neoplasm of breast: Secondary | ICD-10-CM

## 2020-08-17 DIAGNOSIS — Z600 Problems of adjustment to life-cycle transitions: Secondary | ICD-10-CM | POA: Diagnosis not present

## 2020-08-17 DIAGNOSIS — Z113 Encounter for screening for infections with a predominantly sexual mode of transmission: Secondary | ICD-10-CM

## 2020-08-17 DIAGNOSIS — N760 Acute vaginitis: Secondary | ICD-10-CM | POA: Insufficient documentation

## 2020-08-17 MED ORDER — SERTRALINE HCL 50 MG PO TABS: 50.0000 mg | ORAL_TABLET | Freq: Every day | ORAL | 1 refills | Status: DC

## 2020-08-17 NOTE — Progress Notes (Signed)
45 y.o. P2 Divorced Caucasian female here for annual exam.    Feels like she is not her anymore.  A nurse friend has expressed concern about her.  Not sleeping well.   Feels like her brain will not shut off even through she is tired. Tried melatonin.  Not eating healthy.  Work is going Firefighter.  Difficulty finding joy.  Not worried about harming herself.   Notes vaginal discharge.  Fuller Song is causing irritation.  Accepts STD screening.   Works for [employer].  47 yo and 63 yo children.  Children moved out 4 months ago, and she is feeling the emptiness of her home.   PCP:  none.   No LMP recorded. Patient has had an implant.     Period Cycle (Days):  (no cycles with Mirena IUD)     Sexually active: Yes.    The current method of family planning is IUD--Mirena 09/2014.    Exercising: Yes.     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 She has never used smokeless tobacco. She reports current alcohol use of about 6.0 standard drinks of alcohol per week. She reports that she does not use drugs.  Past Medical History:  Diagnosis Date   Acute low back pain    Degeneration of lumbar intervertebral disc    Family history of breast cancer    Family history of melanoma    History of lumbar discectomy    Lumbar post-laminectomy syndrome     Past Surgical History:  Procedure Laterality Date   ABDOMINAL EXPOSURE N/A 05/08/2019   Procedure: ABDOMINAL EXPOSURE;  Surgeon: Larina Earthly, MD;  Location: [facility] OR;  Service: Vascular;  Laterality: N/A;   ANTERIOR LUMBAR FUSION N/A 05/08/2019   Procedure: ANTERIOR LUMBAR FUSION LUMBAR FOUR- LUMBAR FIVE;  Surgeon:  Venita Lick, MD;  Location: [facility] OR;  Service: Orthopedics;  Laterality: N/A;  3.5 hrs Dr. Arbie Cookey to do approach Left side tap block with exparel   AUGMENTATION MAMMAPLASTY     BACK SURGERY     LUMBAR FUSION  04/21/2020   TONSILLECTOMY      Current Outpatient Medications  Medication Sig Dispense Refill   fluticasone (FLONASE) 50 MCG/ACT nasal spray Place 2 sprays into both nostrils daily as needed for allergies.      levonorgestrel (MIRENA) 20 MCG/24HR IUD 1 each by Intrauterine route once.     No current facility-administered medications for this visit.    Family History  Problem Relation Age of Onset   Breast cancer Mother 5   Melanoma Father        dx mid 48s   Aneurysm Paternal Grandmother    Heart attack Paternal Grandfather    Lung cancer Paternal Uncle     Review of Systems  All other systems reviewed and are negative.  Exam:   BP 138/82 (Cuff Size: Large)   Pulse 74   Ht 5\' 8"  (1.727 m)   Wt 212 lb (96.2 kg)   SpO2 100%   BMI 32.23 kg/m     General appearance: alert, cooperative and appears stated age Head: normocephalic, without obvious abnormality, atraumatic  Neck: no adenopathy, supple, symmetrical, trachea midline and thyroid normal to inspection and palpation Lungs: clear to auscultation bilaterally Breasts: consistent with bilateral augmentation, no masses or tenderness, No nipple retraction or dimpling, No nipple discharge or bleeding, No axillary adenopathy Heart: regular rate and rhythm Abdomen: soft, non-tender; no masses, no organomegaly Extremities: extremities normal, atraumatic, no cyanosis or edema Skin: skin color, texture, turgor normal. No rashes or lesions Lymph nodes: cervical, supraclavicular, and axillary nodes normal. Neurologic: grossly normal  Pelvic: External genitalia:  no lesions              No abnormal inguinal nodes palpated.              Urethra:  normal appearing urethra with no masses, tenderness or lesions               Bartholins and Skenes: normal                 Vagina: normal appearing vagina with normal color and discharge, no lesions              Cervix: no lesions.  IUD strings noted.               Pap taken:no Bimanual Exam:  Uterus:  normal size, contour, position, consistency, mobility, non-tender              Adnexa: no mass, fullness, tenderness              Rectal exam: yes.Confirms.              Anus:  normal sphincter tone, no lesions  Chaperone was present for exam.  Assessment:   Well woman visit with normal exam. Mirena IUD.  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  Hx breast augmentation.  FH breast cancer - mother.  Negative genetic testing.  Has a variant of undetermined significance.  Life adjustment.   Plan: Mammogram screening discussed. Self breast awareness reviewed. Pap and HR HPV 2023.  Guidelines for Calcium, Vitamin D, regular exercise program including cardiovascular and weight bearing exercise. Start Zoloft 50 mg daily.  Potential side effects reviewed.  List of counselors to patient.  STD screening, routine labs.  Vaginitis testing.  Fu in 6 weeks and for annual exam.

## 2020-08-17 NOTE — Patient Instructions (Addendum)
Sertraline Tablets What is this medication? SERTRALINE (SER tra leen) treats depression, anxiety, obsessive-compulsive disorder (OCD), post-traumatic stress disorder (PTSD), and premenstrual dysphoric disorder (PMDD). It increases the amount of serotonin in the brain, a hormone that helps regulate mood. It belongs to a group of medications calledSSRIs. This medicine may be used for other purposes; ask your health care provider orpharmacist if you have questions. COMMON BRAND NAME(S): Zoloft What should I tell my care team before I take this medication? They need to know if you have any of these conditions: Bleeding disorders Bipolar disorder or a family history of bipolar disorder Glaucoma Heart disease High blood pressure History of irregular heartbeat History of low levels of calcium, magnesium, or potassium in the blood If you often drink alcohol Liver disease Receiving electroconvulsive therapy Seizures Suicidal thoughts, plans, or attempt; a previous suicide attempt by you or a family member Take medications that treat or prevent blood clots Thyroid disease An unusual or allergic reaction to sertraline, other medications, foods, dyes, or preservatives Pregnant or trying to get pregnant Breast-feeding How should I use this medication? Take this medication by mouth with a glass of water. Follow the directions on the prescription label. You can take it with or without food. Take your medication at regular intervals. Do not take your medication more often than directed. Do not stop taking this medication suddenly except upon the advice of your care team. Stopping this medication too quickly may cause serious sideeffects or your condition may worsen. A special MedGuide will be given to you by the pharmacist with eachprescription and refill. Be sure to read this information carefully each time. Talk to your care team about the use of this medication in children. While this medication may be  prescribed for children as young as 7 years for selectedconditions, precautions do apply. Overdosage: If you think you have taken too much of this medicine contact apoison control center or emergency room at once. NOTE: This medicine is only for you. Do not share this medicine with others. What if I miss a dose? If you miss a dose, take it as soon as you can. If it is almost time for yournext dose, take only that dose. Do not take double or extra doses. What may interact with this medication? Do not take this medication with any of the following: Cisapride Dronedarone Linezolid MAOIs like Carbex, Eldepryl, Marplan, Nardil, and Parnate Methylene blue (injected into a vein) Pimozide Thioridazine This medication may also interact with the following: Alcohol Amphetamines Aspirin and aspirin-like medications Certain medications for depression, anxiety, or psychotic disturbances Certain medications for fungal infections like ketoconazole, fluconazole, posaconazole, and itraconazole Certain medications for irregular heart beat like flecainide, quinidine, propafenone Certain medications for migraine headaches like almotriptan, eletriptan, frovatriptan, naratriptan, rizatriptan, sumatriptan, zolmitriptan Certain medications for sleep Certain medications for seizures like carbamazepine, valproic acid, phenytoin Certain medications that treat or prevent blood clots like warfarin, enoxaparin, dalteparin Cimetidine Digoxin Diuretics Fentanyl Isoniazid Lithium NSAIDs, medications for pain and inflammation, like ibuprofen or naproxen Other medications that prolong the QT interval (cause an abnormal heart rhythm) like dofetilide Rasagiline Safinamide Supplements like St. John's wort, kava kava, valerian Tolbutamide Tramadol Tryptophan This list may not describe all possible interactions. Give your health care provider a list of all the medicines, herbs, non-prescription drugs, or dietary  supplements you use. Also tell them if you smoke, drink alcohol, or use illegaldrugs. Some items may interact with your medicine. What should I watch for while using this medication? Tell  your health care provider if your symptoms do not get better or if they get worse. Visit your health care provider for regular checks on your progress. Because it may take several weeks to see the full effects of this medication, it is important to continue your treatment as prescribed by your health careprovider. Patients and their families should watch out for new or worsening thoughts of suicide or depression. Also watch out for sudden changes in feelings such as feeling anxious, agitated, panicky, irritable, hostile, aggressive, impulsive, severely restless, overly excited and hyperactive, or not being able to sleep. If this happens, especially at the beginning of treatment or after a change indose, call your health care provider. You may get drowsy or dizzy. Do not drive, use machinery, or do anything that needs mental alertness until you know how this medication affects you. Do not stand or sit up quickly, especially if you are an older patient. This reduces the risk of dizzy or fainting spells. Alcohol may interfere with the effect ofthis medication. Your mouth may get dry. Chewing sugarless gum or sucking hard candy, and drinking plenty of water may help. Contact your health care provider if theproblem does not go away or is severe. What side effects may I notice from receiving this medication? Side effects that you should report to your care team as soon as possible: Allergic reactions-skin rash, itching, hives, swelling of the face, lips, tongue, or throat Bleeding-bloody or black, tar-like stools, red or dark brown urine, vomiting blood or brown material that looks like coffee grounds, small red or purple spots on skin, unusual bleeding or bruising Heart rhythm changes-fast or irregular heartbeat, dizziness,  feeling faint or lightheaded, chest pain, trouble breathing Low sodium level-muscle weakness, fatigue, dizziness, headache, confusion Serotonin syndrome-irritability, confusion, fast or irregular heartbeat, muscle stiffness, twitching muscles, sweating, high fever, seizure, chills, vomiting, diarrhea Sudden eye pain or change in vision such as blurred vision, seeing halos around lights, vision loss Thoughts of suicide or self-harm, worsening mood Side effects that usually do not require medical attention (report these toyour care team if they continue or are bothersome): Change in sex drive or performance Diarrhea Excessive sweating Nausea Tremors or shaking Upset stomach This list may not describe all possible side effects. Call your doctor for medical advice about side effects. You may report side effects to FDA at1-800-FDA-1088. Where should I keep my medication? Keep out of the reach of children and pets. Store at room temperature between 15 and 30 degrees C (59 and 86 degrees F).Get rid of any unused medication after the expiration date. To get rid of medications that are no longer needed or expired: Take the medication to a medication take-back program. Check with your pharmacy or law enforcement to find a location. If you cannot return the medication, check the label or package insert to see if the medication should be thrown out in the garbage or flushed down the toilet. If you are not sure, ask your care team. If it is safe to put in the trash, empty the medication out of the container. Mix the medication with cat litter, dirt, coffee grounds, or other unwanted substance. Seal the mixture in a bag or container. Put it in the trash. NOTE: This sheet is a summary. It may not cover all possible information. If you have questions about this medicine, talk to your doctor, pharmacist, orhealth care provider.  2022 Elsevier/Gold Standard (2020-03-06 12:29:28)  EXERCISE AND DIET:  We  recommended that you start or  continue a regular exercise program for good health. Regular exercise means any activity that makes your heart beat faster and makes you sweat.  We recommend exercising at least 30 minutes per day at least 3 days a week, preferably 4 or 5.  We also recommend a diet low in fat and sugar.  Inactivity, poor dietary choices and obesity can cause diabetes, heart attack, stroke, and kidney damage, among others.    ALCOHOL AND SMOKING:  Women should limit their alcohol intake to no more than 7 drinks/beers/glasses of wine (combined, not each!) per week. Moderation of alcohol intake to this level decreases your risk of breast cancer and liver damage. And of course, no recreational drugs are part of a healthy lifestyle.  And absolutely no smoking or even second hand smoke. Most people know smoking can cause heart and lung diseases, but did you know it also contributes to weakening of your bones? Aging of your skin?  Yellowing of your teeth and nails?  CALCIUM AND VITAMIN D:  Adequate intake of calcium and Vitamin D are recommended.  The recommendations for exact amounts of these supplements seem to change often, but generally speaking 600 mg of calcium (either carbonate or citrate) and 800 units of Vitamin D per day seems prudent. Certain women may benefit from higher intake of Vitamin D.  If you are among these women, your doctor will have told you during your visit.    PAP SMEARS:  Pap smears, to check for cervical cancer or precancers,  have traditionally been done yearly, although recent scientific advances have shown that most women can have pap smears less often.  However, every woman still should have a physical exam from her gynecologist every year. It will include a breast check, inspection of the vulva and vagina to check for abnormal growths or skin changes, a visual exam of the cervix, and then an exam to evaluate the size and shape of the uterus and ovaries.  And after 47  years of age, a rectal exam is indicated to check for rectal cancers. We will also provide age appropriate advice regarding health maintenance, like when you should have certain vaccines, screening for sexually transmitted diseases, bone density testing, colonoscopy, mammograms, etc.   MAMMOGRAMS:  All women over 12 years old should have a yearly mammogram. Many facilities now offer a "3D" mammogram, which may cost around $50 extra out of pocket. If possible,  we recommend you accept the option to have the 3D mammogram performed.  It both reduces the number of women who will be called back for extra views which then turn out to be normal, and it is better than the routine mammogram at detecting truly abnormal areas.    COLONOSCOPY:  Colonoscopy to screen for colon cancer is recommended for all women at age 72.  We know, you hate the idea of the prep.  We agree, BUT, having colon cancer and not knowing it is worse!!  Colon cancer so often starts as a polyp that can be seen and removed at colonscopy, which can quite literally save your life!  And if your first colonoscopy is normal and you have no family history of colon cancer, most women don't have to have it again for 10 years.  Once every ten years, you can do something that may end up saving your life, right?  We will be happy to help you get it scheduled when you are ready.  Be sure to check your insurance coverage so you  understand how much it will cost.  It may be covered as a preventative service at no cost, but you should check your particular policy.

## 2020-08-18 LAB — CBC
HCT: 47.8 % — ABNORMAL HIGH (ref 35.0–45.0)
Hemoglobin: 16.3 g/dL — ABNORMAL HIGH (ref 11.7–15.5)
MCH: 32.9 pg (ref 27.0–33.0)
MCHC: 34.1 g/dL (ref 32.0–36.0)
MCV: 96.6 fL (ref 80.0–100.0)
MPV: 9.2 fL (ref 7.5–12.5)
Platelets: 341 10*3/uL (ref 140–400)
RBC: 4.95 10*6/uL (ref 3.80–5.10)
RDW: 12.1 % (ref 11.0–15.0)
WBC: 6.6 10*3/uL (ref 3.8–10.8)

## 2020-08-18 LAB — LIPID PANEL
Cholesterol: 208 mg/dL — ABNORMAL HIGH (ref <200)
HDL: 62 mg/dL (ref >=50)
LDL Cholesterol (Calc): 112 mg/dL (calc) — ABNORMAL HIGH
Non-HDL Cholesterol (Calc): 146 mg/dL (calc) — ABNORMAL HIGH (ref <130)
Total CHOL/HDL Ratio: 3.4 (calc) (ref <5.0)
Triglycerides: 220 mg/dL — ABNORMAL HIGH (ref <150)

## 2020-08-18 LAB — COMPREHENSIVE METABOLIC PANEL
AG Ratio: 1.6 (calc) (ref 1.0–2.5)
ALT: 16 U/L (ref 6–29)
AST: 17 U/L (ref 10–35)
Albumin: 4.4 g/dL (ref 3.6–5.1)
Alkaline phosphatase (APISO): 118 U/L (ref 31–125)
BUN: 10 mg/dL (ref 7–25)
CO2: 27 mmol/L (ref 20–32)
Calcium: 9.4 mg/dL (ref 8.6–10.2)
Chloride: 101 mmol/L (ref 98–110)
Creat: 0.82 mg/dL (ref 0.50–1.10)
Globulin: 2.8 g/dL (calc) (ref 1.9–3.7)
Glucose, Bld: 98 mg/dL (ref 65–99)
Potassium: 4.5 mmol/L (ref 3.5–5.3)
Sodium: 137 mmol/L (ref 135–146)
Total Bilirubin: 0.4 mg/dL (ref 0.2–1.2)
Total Protein: 7.2 g/dL (ref 6.1–8.1)

## 2020-08-18 LAB — CERVICOVAGINAL ANCILLARY ONLY
Bacterial Vaginitis (gardnerella): POSITIVE — AB
Candida Glabrata: NEGATIVE
Candida Vaginitis: NEGATIVE
Chlamydia: NEGATIVE
Comment: NEGATIVE
Comment: NEGATIVE
Comment: NEGATIVE
Comment: NEGATIVE
Comment: NEGATIVE
Comment: NORMAL
Neisseria Gonorrhea: NEGATIVE
Trichomonas: NEGATIVE

## 2020-08-18 LAB — HEPATITIS C ANTIBODY
Hepatitis C Ab: NONREACTIVE
SIGNAL TO CUT-OFF: 0.01 (ref <1.00)

## 2020-08-18 LAB — HIV ANTIBODY (ROUTINE TESTING W REFLEX): HIV 1&2 Ab, 4th Generation: NONREACTIVE

## 2020-08-18 LAB — RPR: RPR Ser Ql: NONREACTIVE

## 2020-08-18 LAB — TSH: TSH: 1.81 mIU/L

## 2020-08-18 LAB — HEPATITIS B SURFACE ANTIGEN: Hepatitis B Surface Ag: NONREACTIVE

## 2020-08-18 LAB — VITAMIN D 25 HYDROXY (VIT D DEFICIENCY, FRACTURES): Vit D, 25-Hydroxy: 55 ng/mL (ref 30–100)

## 2020-08-19 NOTE — Addendum Note (Signed)
Addended by: Ardell Isaacs, Debbe Bales E on: 08/19/2020 08:15 PM   Modules accepted: Orders

## 2020-08-20 ENCOUNTER — Other Ambulatory Visit: Payer: Self-pay

## 2020-08-20 MED ORDER — METRONIDAZOLE 500 MG PO TABS: 500.0000 mg | ORAL_TABLET | Freq: Two times a day (BID) | ORAL | 0 refills | Status: DC

## 2020-09-28 ENCOUNTER — Ambulatory Visit: Payer: BC Managed Care – PPO | Admitting: Obstetrics and Gynecology

## 2020-10-06 ENCOUNTER — Other Ambulatory Visit: Payer: Self-pay

## 2020-10-06 ENCOUNTER — Encounter: Payer: Self-pay | Admitting: Obstetrics and Gynecology

## 2020-10-06 ENCOUNTER — Ambulatory Visit (INDEPENDENT_AMBULATORY_CARE_PROVIDER_SITE_OTHER): Payer: BC Managed Care – PPO | Admitting: Obstetrics and Gynecology

## 2020-10-06 VITALS — BP 122/78 | Ht 68.0 in | Wt 212.0 lb

## 2020-10-06 DIAGNOSIS — F4320 Adjustment disorder, unspecified: Secondary | ICD-10-CM | POA: Diagnosis not present

## 2020-10-06 DIAGNOSIS — D582 Other hemoglobinopathies: Secondary | ICD-10-CM

## 2020-10-06 DIAGNOSIS — Z5181 Encounter for therapeutic drug level monitoring: Secondary | ICD-10-CM | POA: Diagnosis not present

## 2020-10-06 LAB — CBC
HCT: 41.9 % (ref 35.0–45.0)
Hemoglobin: 14.9 g/dL (ref 11.7–15.5)
MCH: 34.2 pg — ABNORMAL HIGH (ref 27.0–33.0)
MCHC: 35.6 g/dL (ref 32.0–36.0)
MCV: 96.1 fL (ref 80.0–100.0)
MPV: 9.4 fL (ref 7.5–12.5)
Platelets: 281 10*3/uL (ref 140–400)
RBC: 4.36 10*6/uL (ref 3.80–5.10)
RDW: 11.9 % (ref 11.0–15.0)
WBC: 7 10*3/uL (ref 3.8–10.8)

## 2020-10-06 MED ORDER — SERTRALINE HCL 50 MG PO TABS: 50.0000 mg | ORAL_TABLET | Freq: Every day | ORAL | 3 refills | Status: DC

## 2020-10-06 NOTE — Progress Notes (Signed)
GYNECOLOGY  VISIT   HPI: 45 y.o.   Married  Caucasian  female   No obstetric history on file. with No LMP recorded. Patient has had an implant.   here for medication follow up.    Started Zoloft after her visit at the end of June.  She is undergoing life adjustment after her children moved out.   She feels better and lighter since starting the medication. Crying less and is now wearing make up again.   Taking medication at night because it makes her sleepy. Takes it four days a week.   Eating more healthy.  Lost 9 pounds.   Feeling like doing more activity, more energy.  Her nurse friend notices she is better.  Is reaching out to her sister and her friend.  Doing ok without counseling.  Denies suicidal ideation.   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   On vacation this week.   Patient had an elevated hemoglobin and is due for a recheck of this.  GYNECOLOGIC HISTORY: No LMP recorded. Patient has had an implant. Contraception:  Mirena IUD 09/2014 Menopausal hormone therapy:  Last mammogram:   11-14-18 3D/Neg/Birads1. Has appt. 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    Past Medical History:  Diagnosis Date   Acute low back pain    Degeneration of lumbar intervertebral disc    Family history of breast cancer    Family history of  melanoma    History of lumbar discectomy    Lumbar post-laminectomy syndrome     Past Surgical History:  Procedure Laterality Date   ABDOMINAL EXPOSURE N/A 05/08/2019   Procedure: ABDOMINAL EXPOSURE;  Surgeon: Larina Earthly, MD;  Location: [facility] OR;  Service: Vascular;  Laterality: N/A;   ANTERIOR LUMBAR FUSION N/A 05/08/2019   Procedure: ANTERIOR LUMBAR FUSION LUMBAR FOUR- LUMBAR FIVE;  Surgeon: Venita Lick, MD;  Location: [facility] OR;  Service: Orthopedics;  Laterality: N/A;  3.5 hrs Dr. Arbie Cookey to do approach Left side tap block with exparel   AUGMENTATION MAMMAPLASTY     BACK SURGERY     LUMBAR FUSION  04/21/2020   TONSILLECTOMY      Current Outpatient Medications  Medication Sig Dispense Refill   fluticasone (FLONASE) 50 MCG/ACT nasal spray Place 2 sprays into both nostrils daily as needed for allergies.      levonorgestrel (MIRENA) 20 MCG/24HR IUD 1 each by Intrauterine route once.     sertraline (ZOLOFT) 50 MG tablet Take  1 tablet (50 mg total) by mouth daily. 30 tablet 1   No current facility-administered medications for this visit.     ALLERGIES: Patient has no known allergies.  Family History  Problem Relation Age of Onset   Breast cancer Mother 79   Melanoma Father        dx mid 16s   Aneurysm Paternal Grandmother    Heart attack Paternal Grandfather    Lung cancer Paternal Uncle     Social History   Socioeconomic History   Marital status: Married    Spouse name: Not on file   Number of children: Not on file   Years of education: Not on file   Highest education level: Not on file  Occupational History   Not on file  Tobacco Use   Smoking status: Never   Smokeless tobacco: Never  Vaping Use   Vaping Use: Never used  Substance and Sexual Activity   Alcohol use: Yes    Alcohol/week: 6.0 standard drinks    Types: 6 Standard drinks or equivalent per week   Drug use: No   Sexual activity: Yes    Birth control/protection: None, I.U.D.    Comment:  VASectomy//Mirena 09/2014  Other Topics Concern   Not on file  Social History Narrative   Not on file   Social Determinants of Health   Financial Resource Strain: Not on file  Food Insecurity: Not on file  Transportation Needs: Not on file  Physical Activity: Not on file  Stress: Not on file  Social Connections: Not on file  Intimate Partner Violence: Not on file    Review of Systems  See HPI.  PHYSICAL EXAMINATION:    BP 122/78   Ht 5\' 8"  (1.727 m)   Wt 212 lb (96.2 kg)   BMI 32.23 kg/m     General appearance: alert, cooperative and appears stated age  ASSESSMENT  Life adjustment.  Medication monitoring.  Doing well on Zoloft.  Elevated hemoglobin.   PLAN  Refill of Zoloft 50 mg daily.  Check CBC.  Return for annual exam and prn.   An After Visit Summary was printed and given to the patient.

## 2020-10-08 ENCOUNTER — Ambulatory Visit: Payer: BC Managed Care – PPO

## 2020-10-10 ENCOUNTER — Ambulatory Visit
Admission: RE | Admit: 2020-10-10 | Discharge: 2020-10-10 | Disposition: A | Discharge status: Home / Self Care | Payer: BC Managed Care – PPO | Source: Ambulatory Visit | Attending: Obstetrics and Gynecology | Admitting: Obstetrics and Gynecology

## 2020-10-10 ENCOUNTER — Other Ambulatory Visit: Payer: Self-pay

## 2020-10-10 DIAGNOSIS — Z1231 Encounter for screening mammogram for malignant neoplasm of breast: Secondary | ICD-10-CM

## 2021-09-30 NOTE — Progress Notes (Unsigned)
46 y.o.  Married Caucasian female here for annual exam.    Has appointment for IUD exchange 10-05-21.  Rare menses with her IUD, maybe three times a year and has spotting only.  Doing OK being off Zoloft.   PCP:   None  No LMP recorded. (Menstrual status: IUD).           Sexually active: Yes.    The current method of family planning is IUD--Mirena 09/2014.    Exercising: Yes.     Works at [employer] Smoker:  no  Health Maintenance: Pap: 05-15-18 Neg:Neg HR HPV, 02-09-16 Neg:Neg HR HPV, 10-01-13 ASCUS:Pos HR HPV History of abnormal Pap:  yes, Hx LEEP 2015 CIN II MMG:  10-10-20 Neg/Birads1 Colonoscopy:  NEVER BMD:  n/a  Result  n/a TDaP:  Unsure Gardasil:   no HIV: 08-17-20  NR Hep C: 08-17-20 Neg Screening Labs:  will return for these labs.   reports that she has never smoked. She has never used smokeless tobacco. She reports current alcohol use of about 6.0 standard drinks of alcohol per week. She reports that she does not use drugs.  Past Medical History:  Diagnosis Date   Acute low back pain    Degeneration of lumbar intervertebral disc    Family history of breast cancer    Family history of melanoma    History of lumbar discectomy    Lumbar post-laminectomy syndrome     Past Surgical History:  Procedure Laterality Date   ABDOMINAL EXPOSURE N/A 05/08/2019   Procedure: ABDOMINAL EXPOSURE;  Surgeon: Larina Earthly, MD;  Location: [facility] OR;  Service: Vascular;  Laterality: N/A;   ANTERIOR LUMBAR FUSION N/A 05/08/2019   Procedure: ANTERIOR LUMBAR FUSION LUMBAR FOUR- LUMBAR FIVE;  Surgeon: Venita Lick, MD;  Location: [facility] OR;  Service: Orthopedics;  Laterality: N/A;  3.5 hrs Dr. Arbie Cookey to do approach Left side tap block with exparel   AUGMENTATION MAMMAPLASTY     BACK SURGERY     LUMBAR FUSION  04/21/2020   TONSILLECTOMY      Current Outpatient Medications  Medication Sig Dispense Refill   fluticasone (FLONASE) 50 MCG/ACT nasal spray Place 2 sprays into both nostrils daily as  needed for allergies.      levonorgestrel (MIRENA) 20 MCG/24HR IUD 1 each by Intrauterine route once.     Naproxen Sodium (ALEVE) 220 MG CAPS Take by oral route as needed.     No current facility-administered medications for this visit.    Family History  Problem Relation Age of Onset   Breast cancer Mother 54   Melanoma Father        dx mid 32s   Aneurysm Paternal Grandmother    Heart attack Paternal Grandfather    Lung cancer Paternal Uncle     Review of Systems  All other systems reviewed and are negative.   Exam:   BP 120/76   Pulse (!) 107   Ht 5\' 8"  (1.727 m)   Wt 205 lb (93 kg)   SpO2 98%   BMI 31.17 kg/m     General appearance: alert, cooperative and appears stated age Head: normocephalic, without obvious abnormality, atraumatic Neck: no adenopathy, supple, symmetrical, trachea midline and thyroid normal to inspection and palpation Lungs: clear to auscultation bilaterally Breasts: normal appearance, consistent with augmentation, no masses or tenderness, No nipple retraction or dimpling, No nipple discharge or bleeding, No axillary adenopathy Heart: regular rate and rhythm Abdomen: soft, non-tender; no masses, no organomegaly Extremities: extremities normal, atraumatic, no  cyanosis or edema Skin: skin color, texture, turgor normal. No rashes or lesions Lymph nodes: cervical, supraclavicular, and axillary nodes normal. Neurologic: grossly normal  Pelvic: External genitalia:  no lesions              No abnormal inguinal nodes palpated.              Urethra:  normal appearing urethra with no masses, tenderness or lesions              Bartholins and Skenes: normal                 Vagina: normal appearing vagina with normal color and discharge, no lesions              Cervix: no lesions.  IUD strings noted.               Pap taken: yes Bimanual Exam:  Uterus:  normal size, contour, position, consistency, mobility, non-tender              Adnexa: no mass, fullness,  tenderness              Rectal exam: yes.  Confirms.              Anus:  normal sphincter tone, no lesions  Chaperone was present for exam:  Marchelle Folks, CMA  Assessment:   Well woman visit with gynecologic exam. Mirena IUD.  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  Hx breast augmentation.  FH breast cancer - mother.  Negative genetic testing.  Has a variant of undetermined significance.  Colon cancer screening.   Plan: Mammogram screening discussed. Self breast awareness reviewed. Pap and HR HPV collected.  IUD to be exchanged in one year or less if desired.  We discussed that her IUD is still effective for one more year.   Will cancel IUD exchange for tomorrow.  Guidelines for Calcium, Vitamin D, regular exercise program including cardiovascular and weight bearing exercise. Return to do fasting routine labs.  Cologuard ordered.  TDap vaccine today  Follow up annually and prn.   After visit summary provided.

## 2021-10-04 ENCOUNTER — Ambulatory Visit (INDEPENDENT_AMBULATORY_CARE_PROVIDER_SITE_OTHER): Payer: BC Managed Care – PPO | Admitting: Obstetrics and Gynecology

## 2021-10-04 ENCOUNTER — Other Ambulatory Visit (HOSPITAL_COMMUNITY)
Admission: RE | Admit: 2021-10-04 | Discharge: 2021-10-04 | Disposition: A | Discharge status: Home / Self Care | Payer: BC Managed Care – PPO | Source: Ambulatory Visit | Attending: Obstetrics and Gynecology | Admitting: Obstetrics and Gynecology

## 2021-10-04 ENCOUNTER — Telehealth: Payer: Self-pay

## 2021-10-04 ENCOUNTER — Encounter: Payer: Self-pay | Admitting: Obstetrics and Gynecology

## 2021-10-04 VITALS — BP 120/76 | HR 107 | Ht 68.0 in | Wt 205.0 lb

## 2021-10-04 DIAGNOSIS — Z1211 Encounter for screening for malignant neoplasm of colon: Secondary | ICD-10-CM

## 2021-10-04 DIAGNOSIS — Z23 Encounter for immunization: Secondary | ICD-10-CM

## 2021-10-04 DIAGNOSIS — Z01419 Encounter for gynecological examination (general) (routine) without abnormal findings: Secondary | ICD-10-CM

## 2021-10-04 DIAGNOSIS — Z0000 Encounter for general adult medical examination without abnormal findings: Secondary | ICD-10-CM

## 2021-10-04 DIAGNOSIS — Z124 Encounter for screening for malignant neoplasm of cervix: Secondary | ICD-10-CM | POA: Insufficient documentation

## 2021-10-04 NOTE — Telephone Encounter (Signed)
Last AEX 08/17/2020 In that office note "The current method of family planning is IUD--Mirena 09/2014."  I received order from front desk to place IUD insertion order as patient is scheduled for IUD exchange on 10/05/21.  She is scheduled for AEX on 10/04/2021 at 1:30pm.  Please advise.

## 2021-10-04 NOTE — Progress Notes (Deleted)
GYNECOLOGY  VISIT   HPI: 46 y.o.   Married  Caucasian  female   No obstetric history on file. with No LMP recorded. Patient has had an implant.   here for Mirena IUD exchange.     GYNECOLOGIC HISTORY: No LMP recorded. Patient has had an implant. Contraception:  Mirena IUD 09/2014 Menopausal hormone therapy:  n/a Last mammogram:  10-10-20 Neg/BiRads1 Last pap smear: 05-15-18 Neg:Neg HR HPV, 02-09-16 Neg:Neg HR HPV, 10-01-13 ASCUS:Pos HR HPV         OB History     Gravida      Para      Term      Preterm      AB      Living  2      SAB      IAB      Ectopic      Multiple      Live Births                 Patient Active Problem List   Diagnosis Date Noted   S/P lumbar fusion 05/08/2019   Genetic testing 08/17/2018   Family history of breast cancer    Family history of melanoma    Family history of breast cancer in mother 05/15/2018   Mastodynia of right breast 01/20/2016   IUD (intrauterine device) in place 10/07/2014   CIN II (cervical intraepithelial neoplasia II) 11/14/2013    Past Medical History:  Diagnosis Date   Acute low back pain    Degeneration of lumbar intervertebral disc    Family history of breast cancer    Family history of melanoma    History of lumbar discectomy    Lumbar post-laminectomy syndrome     Past Surgical History:  Procedure Laterality Date   ABDOMINAL EXPOSURE N/A 05/08/2019   Procedure: ABDOMINAL EXPOSURE;  Surgeon: Larina Earthly, MD;  Location: [facility];  Service: Vascular;  Laterality: N/A;   ANTERIOR LUMBAR FUSION N/A 05/08/2019   Procedure: ANTERIOR LUMBAR FUSION LUMBAR FOUR- LUMBAR FIVE;  Surgeon: Venita Lick, MD;  Location: [facility];  Service: Orthopedics;  Laterality: N/A;  3.5 hrs Dr. Arbie Cookey to do approach Left side tap block with exparel   AUGMENTATION MAMMAPLASTY     BACK SURGERY     LUMBAR FUSION  04/21/2020   TONSILLECTOMY      Current Outpatient Medications  Medication Sig Dispense Refill   fluticasone  (FLONASE) 50 MCG/ACT nasal spray Place 2 sprays into both nostrils daily as needed for allergies.      levonorgestrel (MIRENA) 20 MCG/24HR IUD 1 each by Intrauterine route once.     sertraline (ZOLOFT) 50 MG tablet Take 1 tablet (50 mg total) by mouth daily. 90 tablet 3   No current facility-administered medications for this visit.     ALLERGIES: Patient has no known allergies.  Family History  Problem Relation Age of Onset   Breast cancer Mother 75   Melanoma Father        dx mid 81s   Aneurysm Paternal Grandmother    Heart attack Paternal Grandfather    Lung cancer Paternal Uncle     Social History   Socioeconomic History   Marital status: Married    Spouse name: Not on file   Number of children: Not on file   Years of education: Not on file   Highest education level: Not on file  Occupational History   Not on file  Tobacco Use   Smoking status:  Never   Smokeless tobacco: Never  Vaping Use   Vaping Use: Never used  Substance and Sexual Activity   Alcohol use: Yes    Alcohol/week: 6.0 standard drinks of alcohol    Types: 6 Standard drinks or equivalent per week   Drug use: No   Sexual activity: Yes    Birth control/protection: None, I.U.D.    Comment: VASectomy//Mirena 09/2014  Other Topics Concern   Not on file  Social History Narrative   Not on file   Social Determinants of Health   Financial Resource Strain: Not on file  Food Insecurity: Not on file  Transportation Needs: Not on file  Physical Activity: Not on file  Stress: Not on file  Social Connections: Not on file  Intimate Partner Violence: Not on file    Review of Systems  PHYSICAL EXAMINATION:    There were no vitals taken for this visit.    General appearance: alert, cooperative and appears stated age Head: Normocephalic, without obvious abnormality, atraumatic Neck: no adenopathy, supple, symmetrical, trachea midline and thyroid normal to inspection and palpation Lungs: clear to  auscultation bilaterally Breasts: normal appearance, no masses or tenderness, No nipple retraction or dimpling, No nipple discharge or bleeding, No axillary or supraclavicular adenopathy Heart: regular rate and rhythm Abdomen: soft, non-tender, no masses,  no organomegaly Extremities: extremities normal, atraumatic, no cyanosis or edema Skin: Skin color, texture, turgor normal. No rashes or lesions Lymph nodes: Cervical, supraclavicular, and axillary nodes normal. No abnormal inguinal nodes palpated Neurologic: Grossly normal  Pelvic: External genitalia:  no lesions              Urethra:  normal appearing urethra with no masses, tenderness or lesions              Bartholins and Skenes: normal                 Vagina: normal appearing vagina with normal color and discharge, no lesions              Cervix: no lesions                Bimanual Exam:  Uterus:  normal size, contour, position, consistency, mobility, non-tender              Adnexa: no mass, fullness, tenderness              Rectal exam: {yes no:314532}.  Confirms.              Anus:  normal sphincter tone, no lesions  Chaperone was present for exam:  ***  ASSESSMENT     PLAN     An After Visit Summary was printed and given to the patient.  ______ minutes face to face time of which over 50% was spent in counseling.

## 2021-10-04 NOTE — Patient Instructions (Signed)

## 2021-10-05 ENCOUNTER — Ambulatory Visit: Payer: BC Managed Care – PPO | Admitting: Obstetrics and Gynecology

## 2021-10-05 NOTE — Telephone Encounter (Signed)
IUD exchange is cancelled for today.  Her IUD is still effective for another year.

## 2021-10-10 LAB — CYTOLOGY - PAP
Comment: NEGATIVE
Diagnosis: UNDETERMINED — AB
High risk HPV: NEGATIVE

## 2021-11-07 LAB — COLOGUARD: COLOGUARD: NEGATIVE

## 2022-10-15 IMAGING — MG DIGITAL SCREENING BREAST BILAT IMPLANT W/ TOMO W/ CAD
8 of 12 series · 8 of 28 positions shown · non-contrast
Comparison: Previous exam(s).

CLINICAL DATA: Screening.

EXAM:
DIGITAL SCREENING BILATERAL MAMMOGRAM WITH IMPLANTS, CAD AND
TOMOSYNTHESIS
TECHNIQUE: Bilateral screening digital craniocaudal and mediolateral oblique
mammograms were obtained. Bilateral screening digital breast
tomosynthesis was performed. The images were evaluated with
computer-aided detection. Standard and/or implant displaced views
were performed.

[R MLO]
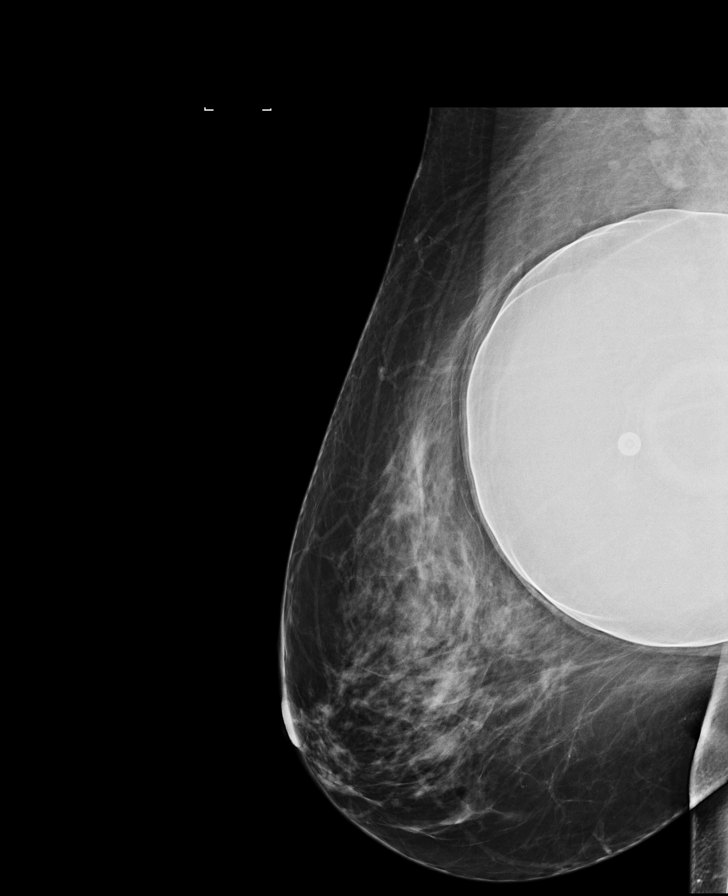

[L MLO]
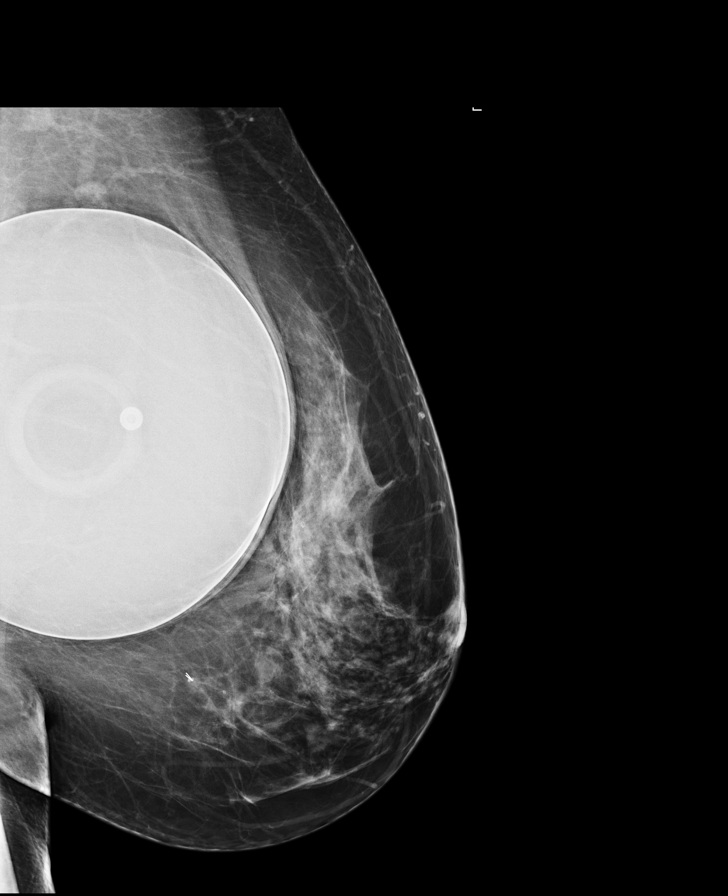

[R CC]
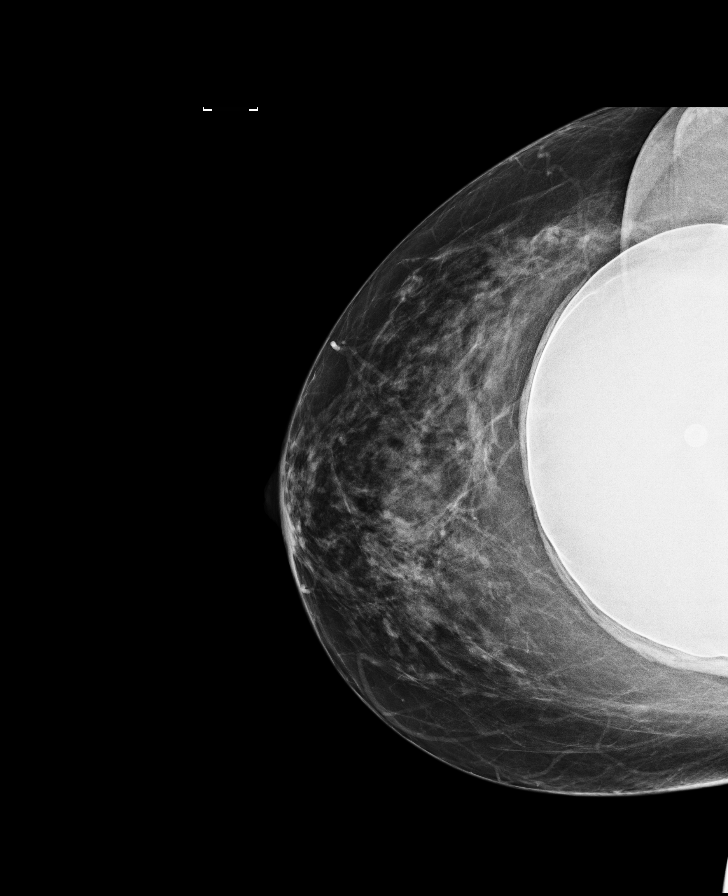

[L CC]
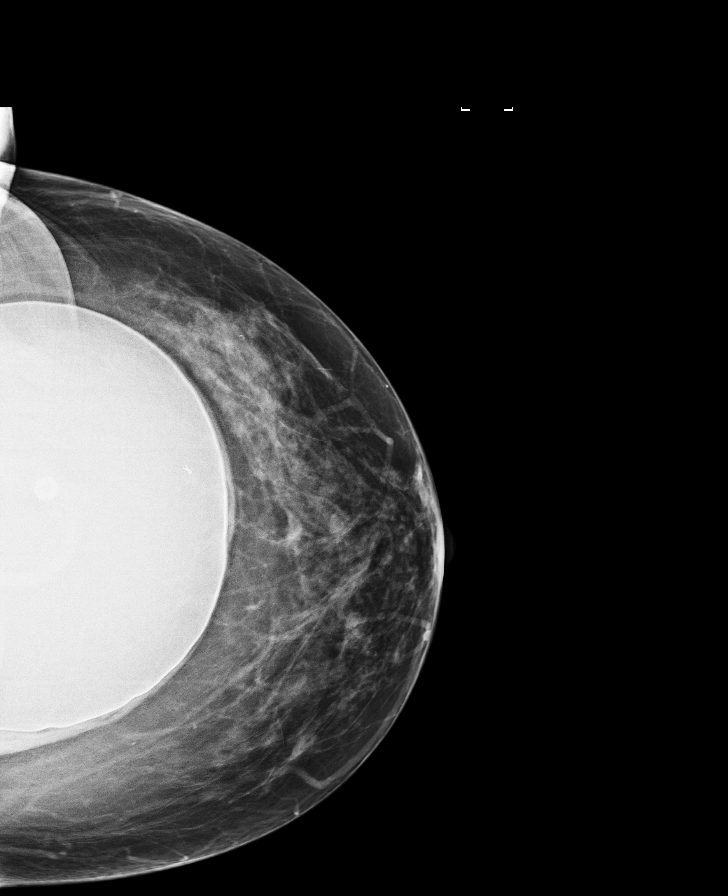

[R CC synth-2D]
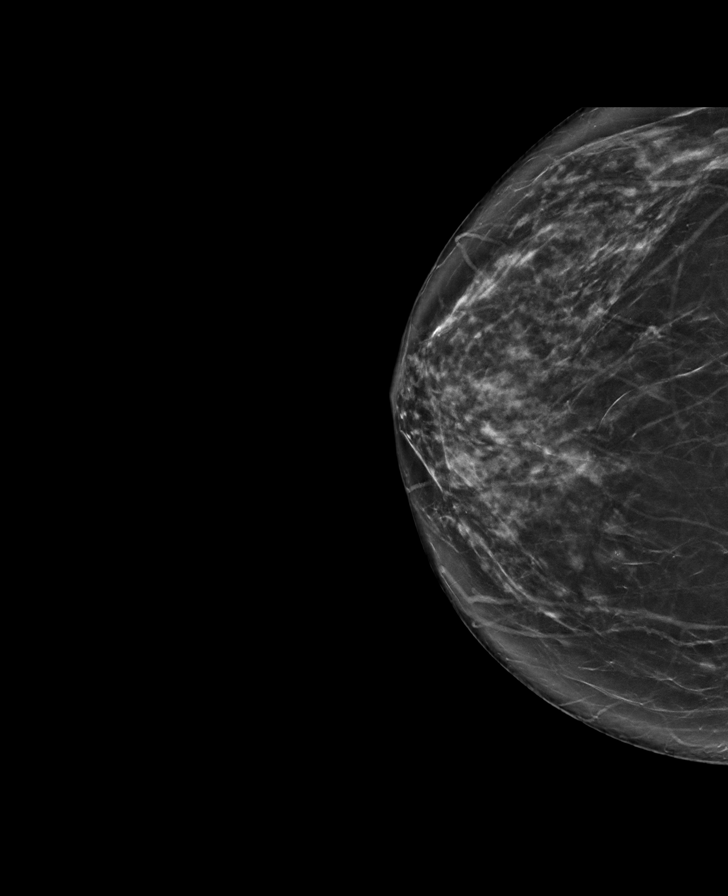

[L CC synth-2D]
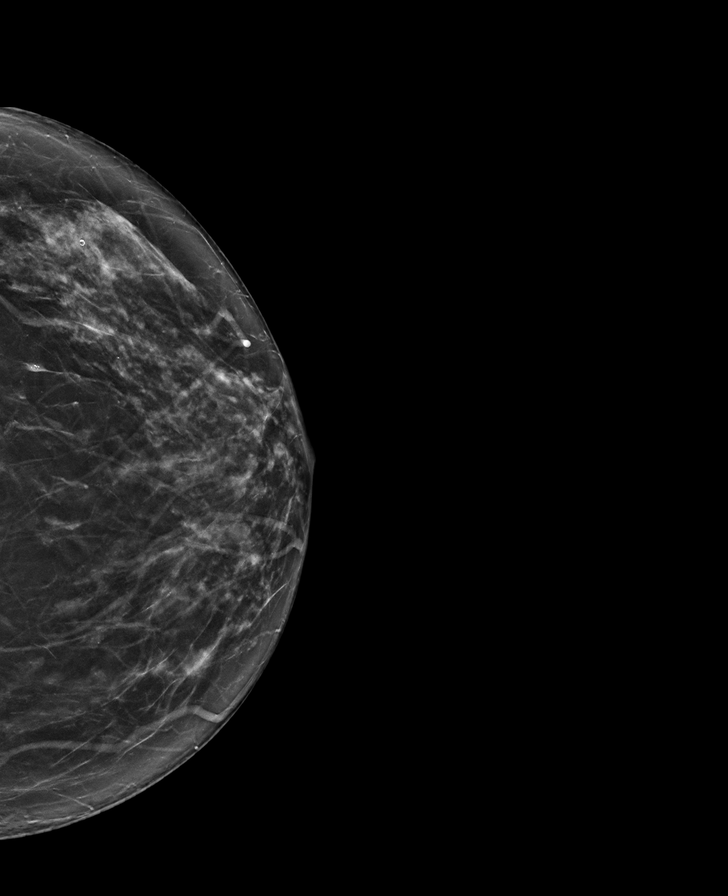

[R MLO synth-2D]
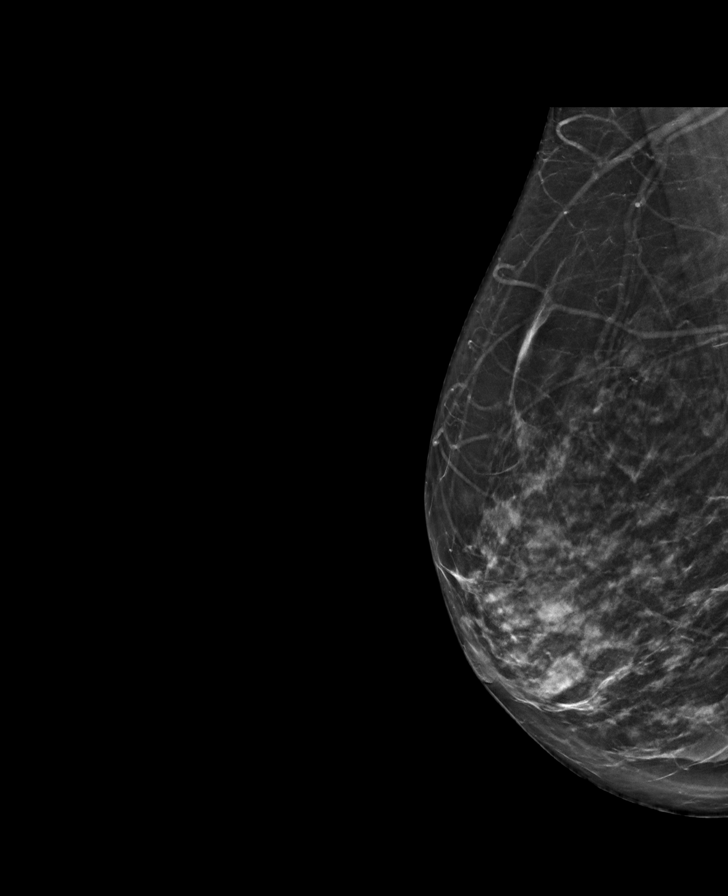

[L MLO synth-2D]
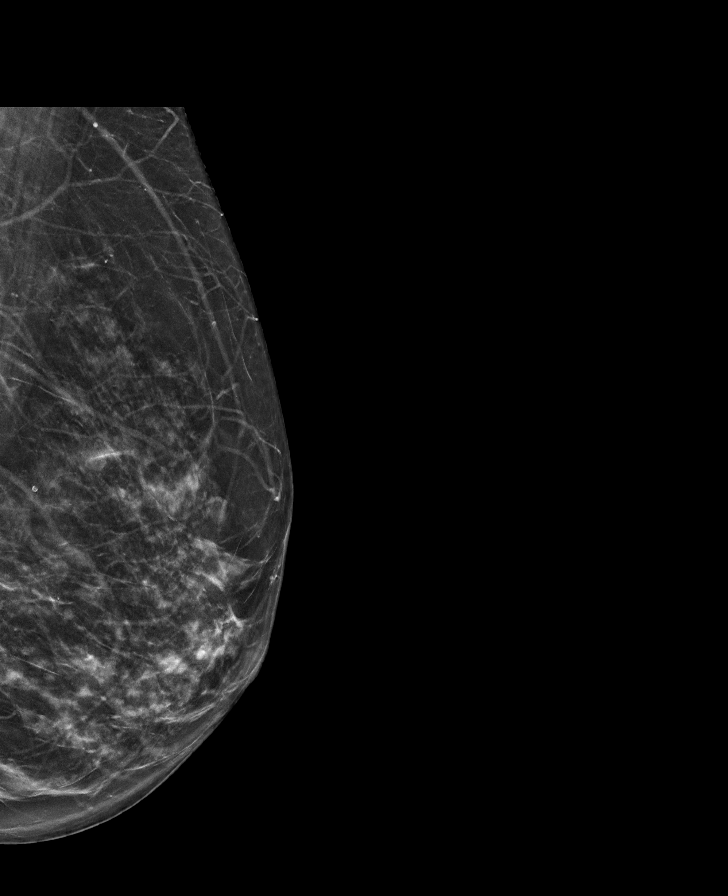

[8 of 28 positions shown; findings below may reference images not displayed]

ACR Breast Density Category c: The breast tissue is heterogeneously
dense, which may obscure small masses.
FINDINGS: The patient has retropectoral implants. There are no findings
suspicious for malignancy.
IMPRESSION: No mammographic evidence of malignancy. A result letter of this
screening mammogram will be mailed directly to the patient.

RECOMMENDATION:
Screening mammogram in one year. (Code:LT-E-7TH)

BI-RADS CATEGORY  1:  Negative.

## 2022-12-19 NOTE — Progress Notes (Unsigned)
47 y.o. No obstetric history on file. Married Caucasian female here for annual exam.    Has IUD exchange tomorrow.  Has occasional spotting.  Occasional hot flash, but states she is hot natured.  Not sexually active often.  Last intercourse was 3 months ago.   Desires STD screening.    Notes vaginal odor.   Recent tx for bronchitis.   PCP: Patient, No Pcp Per   No LMP recorded. (Menstrual status: IUD).          Sexually active: Yes.    The current method of family planning is IUD.  Mirena IUD placed 09/2014.  Exercising: No.   Smoker:  no  OB History  Gravida Para Term Preterm AB Living            2  SAB IAB Ectopic Multiple Live Births                Health Maintenance: Pap:  10/04/21 ASCUS: HR HPV neg, 10/01/13 neg History of abnormal Pap:  yes, hx of LEEP 2015 CIN II MMG: 10/10/20 BI-RADS CAT 1 neg Colonoscopy:  cologuard 11/01/21- negative HM Colonoscopy          Overdue - Colonoscopy (Every 10 Years) Never done    No completion history exists for this topic.           BMD:  n/a  Result  n/a  HIV: 08/17/20 NR Hep C: 08/17/20 NR  Immunization History  Administered Date(s) Administered   Influenza,inj,Quad PF,6+ Mos 11/14/2013   Tdap 10/04/2021       reports that she has never smoked. She has never used smokeless tobacco. She reports current alcohol use of about 6.0 standard drinks of alcohol per week. She reports that she does not use drugs.  Past Medical History:  Diagnosis Date   Acute low back pain    Degeneration of lumbar intervertebral disc    Family history of breast cancer    Family history of melanoma    History of lumbar discectomy    Lumbar post-laminectomy syndrome     Past Surgical History:  Procedure Laterality Date   ABDOMINAL EXPOSURE N/A 05/08/2019   Procedure: ABDOMINAL EXPOSURE;  Surgeon: Larina Earthly, MD;  Location: [facility];  Service: Vascular;  Laterality: N/A;   ANTERIOR LUMBAR FUSION N/A 05/08/2019   Procedure:  ANTERIOR LUMBAR FUSION LUMBAR FOUR- LUMBAR FIVE;  Surgeon: Venita Lick, MD;  Location: [facility];  Service: Orthopedics;  Laterality: N/A;  3.5 hrs Dr. Arbie Cookey to do approach Left side tap block with exparel   AUGMENTATION MAMMAPLASTY     BACK SURGERY     LUMBAR FUSION  04/21/2020   TONSILLECTOMY      Current Outpatient Medications  Medication Sig Dispense Refill   levonorgestrel (MIRENA) 20 MCG/24HR IUD 1 each by Intrauterine route once.     Naproxen Sodium (ALEVE) 220 MG CAPS Take by oral route as needed.     fluticasone (FLONASE) 50 MCG/ACT nasal spray Place 2 sprays into both nostrils daily as needed for allergies.  (Patient not taking: Reported on 01/02/2023)     No current facility-administered medications for this visit.    Family History  Problem Relation Age of Onset   Breast cancer Mother 66   Melanoma Father        dx mid 58s   Aneurysm Paternal Grandmother    Heart attack Paternal Grandfather    Lung cancer Paternal Uncle     Review of Systems  All  other systems reviewed and are negative.   Exam:   BP 130/68 (BP Location: Left Arm, Patient Position: Sitting, Cuff Size: Normal)   Pulse 80   Ht 5' 8.75" (1.746 m)   Wt 201 lb (91.2 kg)   SpO2 100%   BMI 29.90 kg/m     General appearance: alert, cooperative and appears stated age Head: normocephalic, without obvious abnormality, atraumatic Neck: no adenopathy, supple, symmetrical, trachea midline and thyroid normal to inspection and palpation Lungs: clear to auscultation bilaterally Breasts: normal appearance, bilateral implants present, no masses or tenderness, No nipple retraction or dimpling, No nipple discharge or bleeding, No axillary adenopathy Heart: regular rate and rhythm Abdomen: soft, non-tender; no masses, no organomegaly Extremities: extremities normal, atraumatic, no cyanosis or edema Skin: skin color, texture, turgor normal. No rashes or lesions Lymph nodes: cervical, supraclavicular, and axillary  nodes normal. Neurologic: grossly normal  Pelvic: External genitalia:  no lesions              No abnormal inguinal nodes palpated.              Urethra:  normal appearing urethra with no masses, tenderness or lesions              Bartholins and Skenes: normal                 Vagina: normal appearing vagina with normal color and discharge, no lesions              Cervix: no lesions.  IUD strings noted.               Pap taken: no Bimanual Exam:  Uterus:  normal size, contour, position, consistency, mobility, non-tender              Adnexa: no mass, fullness, tenderness              Rectal exam: yes.  Confirms.              Anus:  normal sphincter tone, no lesions  Chaperone was present for exam:  Warren Lacy, CMA   Assessment:  Well woman with GYN exam.  Mirena IUD.  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  Vaginal odor.  STD screening.  Hx breast augmentation.  FH breast cancer - mother.  Negative genetic testing.  Has a variant of undetermined significance.   Plan: Mammogram screening discussed.  She will schedule.  Self breast awareness reviewed. Guidelines for Calcium, Vitamin D, regular exercise program including cardiovascular and weight bearing exercise. Pap and HR HPV in 2026.  Wet prep today.  STD screening GC/CT.  Return for IUD exchange. Return for fasting blood work.  FU annually and prn.

## 2022-12-20 NOTE — Progress Notes (Deleted)
GYNECOLOGY  VISIT   HPI: 47 y.o.   Married  Caucasian  female   No obstetric history on file. with No LMP recorded. (Menstrual status: IUD).   here for   mirena exchange  GYNECOLOGIC HISTORY: No LMP recorded. (Menstrual status: IUD). Contraception:  IUD/vasectomy Menopausal hormone therapy:  n/a Last mammogram:  *** Last pap smear:    05-15-18 Neg:Neg HR HPV, 02-09-16 Neg:Neg HR HPV, 10-01-13 ASCUS:Pos HR HPV         OB History     Gravida      Para      Term      Preterm      AB      Living  2      SAB      IAB      Ectopic      Multiple      Live Births                 Patient Active Problem List   Diagnosis Date Noted   S/P lumbar fusion 05/08/2019   Genetic testing 08/17/2018   Family history of breast cancer    Family history of melanoma    Family history of breast cancer in mother 05/15/2018   Mastodynia of right breast 01/20/2016   IUD (intrauterine device) in place 10/07/2014   CIN II (cervical intraepithelial neoplasia II) 11/14/2013    Past Medical History:  Diagnosis Date   Acute low back pain    Degeneration of lumbar intervertebral disc    Family history of breast cancer    Family history of melanoma    History of lumbar discectomy    Lumbar post-laminectomy syndrome     Past Surgical History:  Procedure Laterality Date   ABDOMINAL EXPOSURE N/A 05/08/2019   Procedure: ABDOMINAL EXPOSURE;  Surgeon: Larina Earthly, MD;  Location: [facility];  Service: Vascular;  Laterality: N/A;   ANTERIOR LUMBAR FUSION N/A 05/08/2019   Procedure: ANTERIOR LUMBAR FUSION LUMBAR FOUR- LUMBAR FIVE;  Surgeon: Venita Lick, MD;  Location: [facility];  Service: Orthopedics;  Laterality: N/A;  3.5 hrs Dr. Arbie Cookey to do approach Left side tap block with exparel   AUGMENTATION MAMMAPLASTY     BACK SURGERY     LUMBAR FUSION  04/21/2020   TONSILLECTOMY      Current Outpatient Medications  Medication Sig Dispense Refill   fluticasone (FLONASE) 50 MCG/ACT nasal  spray Place 2 sprays into both nostrils daily as needed for allergies.      levonorgestrel (MIRENA) 20 MCG/24HR IUD 1 each by Intrauterine route once.     Naproxen Sodium (ALEVE) 220 MG CAPS Take by oral route as needed.     No current facility-administered medications for this visit.     ALLERGIES: Patient has no known allergies.  Family History  Problem Relation Age of Onset   Breast cancer Mother 51   Melanoma Father        dx mid 81s   Aneurysm Paternal Grandmother    Heart attack Paternal Grandfather    Lung cancer Paternal Uncle     Social History   Socioeconomic History   Marital status: Married    Spouse name: Not on file   Number of children: Not on file   Years of education: Not on file   Highest education level: Not on file  Occupational History   Not on file  Tobacco Use   Smoking status: Never   Smokeless tobacco: Never  Vaping Use  Vaping status: Never Used  Substance and Sexual Activity   Alcohol use: Yes    Alcohol/week: 6.0 standard drinks of alcohol    Types: 6 Standard drinks or equivalent per week   Drug use: No   Sexual activity: Yes    Birth control/protection: None, I.U.D.    Comment: VASectomy//Mirena 09/2014  Other Topics Concern   Not on file  Social History Narrative   Not on file   Social Determinants of Health   Financial Resource Strain: Not on file  Food Insecurity: Not on file  Transportation Needs: Not on file  Physical Activity: Not on file  Stress: Not on file  Social Connections: Unknown (07/05/2021)   Received from [facility], [facility]   Social Network    Social Network: Not on file  Intimate Partner Violence: Unknown (05/27/2021)   [facility], [facility]   HITS    Physically Hurt: Not on file    Insult or Talk Down To: Not on file    Threaten Physical Harm: Not on file    Scream or Curse: Not on file    Review of Systems  PHYSICAL EXAMINATION:    There were no vitals taken for this  visit.    General appearance: alert, cooperative and appears stated age Head: Normocephalic, without obvious abnormality, atraumatic Neck: no adenopathy, supple, symmetrical, trachea midline and thyroid normal to inspection and palpation Lungs: clear to auscultation bilaterally Breasts: normal appearance, no masses or tenderness, No nipple retraction or dimpling, No nipple discharge or bleeding, No axillary or supraclavicular adenopathy Heart: regular rate and rhythm Abdomen: soft, non-tender, no masses,  no organomegaly Extremities: extremities normal, atraumatic, no cyanosis or edema Skin: Skin color, texture, turgor normal. No rashes or lesions Lymph nodes: Cervical, supraclavicular, and axillary nodes normal. No abnormal inguinal nodes palpated Neurologic: Grossly normal  Pelvic: External genitalia:  no lesions              Urethra:  normal appearing urethra with no masses, tenderness or lesions              Bartholins and Skenes: normal                 Vagina: normal appearing vagina with normal color and discharge, no lesions              Cervix: no lesions                Bimanual Exam:  Uterus:  normal size, contour, position, consistency, mobility, non-tender              Adnexa: no mass, fullness, tenderness              Rectal exam: {yes no:314532}.  Confirms.              Anus:  normal sphincter tone, no lesions  Chaperone was present for exam:  ***  ASSESSMENT     PLAN     An After Visit Summary was printed and given to the patient.  ______ minutes face to face time of which over 50% was spent in counseling.

## 2023-01-02 ENCOUNTER — Other Ambulatory Visit (HOSPITAL_COMMUNITY)
Admission: RE | Admit: 2023-01-02 | Discharge: 2023-01-02 | Disposition: A | Discharge status: Home / Self Care | Payer: Self-pay | Source: Ambulatory Visit | Attending: Obstetrics and Gynecology | Admitting: Obstetrics and Gynecology

## 2023-01-02 ENCOUNTER — Encounter: Payer: Self-pay | Admitting: Obstetrics and Gynecology

## 2023-01-02 ENCOUNTER — Ambulatory Visit (INDEPENDENT_AMBULATORY_CARE_PROVIDER_SITE_OTHER): Payer: BC Managed Care – PPO | Admitting: Obstetrics and Gynecology

## 2023-01-02 VITALS — BP 130/68 | HR 80 | Ht 68.75 in | Wt 201.0 lb

## 2023-01-02 DIAGNOSIS — Z113 Encounter for screening for infections with a predominantly sexual mode of transmission: Secondary | ICD-10-CM

## 2023-01-02 DIAGNOSIS — A599 Trichomoniasis, unspecified: Secondary | ICD-10-CM

## 2023-01-02 DIAGNOSIS — Z01419 Encounter for gynecological examination (general) (routine) without abnormal findings: Secondary | ICD-10-CM

## 2023-01-02 DIAGNOSIS — Z0000 Encounter for general adult medical examination without abnormal findings: Secondary | ICD-10-CM

## 2023-01-02 DIAGNOSIS — N898 Other specified noninflammatory disorders of vagina: Secondary | ICD-10-CM

## 2023-01-02 DIAGNOSIS — Z114 Encounter for screening for human immunodeficiency virus [HIV]: Secondary | ICD-10-CM

## 2023-01-02 DIAGNOSIS — Z1159 Encounter for screening for other viral diseases: Secondary | ICD-10-CM

## 2023-01-02 LAB — WET PREP FOR TRICH, YEAST, CLUE

## 2023-01-02 MED ORDER — METRONIDAZOLE 500 MG PO TABS: 500.0000 mg | ORAL_TABLET | Freq: Two times a day (BID) | ORAL | 0 refills | Status: DC

## 2023-01-02 NOTE — Patient Instructions (Signed)

## 2023-01-03 ENCOUNTER — Ambulatory Visit: Payer: BC Managed Care – PPO | Admitting: Obstetrics and Gynecology

## 2023-01-03 LAB — CERVICOVAGINAL ANCILLARY ONLY
Chlamydia: NEGATIVE
Comment: NEGATIVE
Comment: NORMAL
Neisseria Gonorrhea: NEGATIVE

## 2023-01-03 NOTE — Progress Notes (Unsigned)
GYNECOLOGY  VISIT   HPI: 47 y.o.   Married  Caucasian female   No obstetric history on file. with No LMP recorded. (Menstrual status: IUD).   here for: mirena exchange     Expired 09/2014.  Just treated for BV.   2 vaginal deliveries.   UPT today:  negative.     GYNECOLOGIC HISTORY: No LMP recorded. (Menstrual status: IUD). Contraception:  IUD Menopausal hormone therapy:  n/a Pap:      Component Value Date/Time   DIAGPAP (A) 10/04/2021 1354    - Atypical squamous cells of undetermined significance (ASC-US)   HPVHIGH Negative 10/04/2021 1354   ADEQPAP  10/04/2021 1354    Satisfactory for evaluation; transformation zone component PRESENT.   History of abnormal Pap or positive HPV:  yes, hx of LEEP 2015 CIN II Mammogram:  10/10/20 BI-RADS CAT 1 neg         OB History     Gravida      Para      Term      Preterm      AB      Living  2      SAB      IAB      Ectopic      Multiple      Live Births                 Patient Active Problem List   Diagnosis Date Noted   S/P lumbar fusion 05/08/2019   Genetic testing 08/17/2018   Family history of breast cancer    Family history of melanoma    Family history of breast cancer in mother 05/15/2018   Mastodynia of right breast 01/20/2016   IUD (intrauterine device) in place 10/07/2014   CIN II (cervical intraepithelial neoplasia II) 11/14/2013    Past Medical History:  Diagnosis Date   Acute low back pain    Degeneration of lumbar intervertebral disc    Family history of breast cancer    Family history of melanoma    History of lumbar discectomy    Lumbar post-laminectomy syndrome     Past Surgical History:  Procedure Laterality Date   ABDOMINAL EXPOSURE N/A 05/08/2019   Procedure: ABDOMINAL EXPOSURE;  Surgeon: Larina Earthly, MD;  Location: [facility];  Service: Vascular;  Laterality: N/A;   ANTERIOR LUMBAR FUSION N/A 05/08/2019   Procedure: ANTERIOR LUMBAR FUSION LUMBAR FOUR- LUMBAR FIVE;   Surgeon: Venita Lick, MD;  Location: [facility];  Service: Orthopedics;  Laterality: N/A;  3.5 hrs Dr. Arbie Cookey to do approach Left side tap block with exparel   AUGMENTATION MAMMAPLASTY     BACK SURGERY     LUMBAR FUSION  04/21/2020   TONSILLECTOMY      Current Outpatient Medications  Medication Sig Dispense Refill   levonorgestrel (MIRENA) 20 MCG/24HR IUD 1 each by Intrauterine route once.     Naproxen Sodium (ALEVE) 220 MG CAPS Take by oral route as needed.     fluticasone (FLONASE) 50 MCG/ACT nasal spray Place 2 sprays into both nostrils daily as needed for allergies.  (Patient not taking: Reported on 01/02/2023)     No current facility-administered medications for this visit.     ALLERGIES: Patient has no known allergies.  Family History  Problem Relation Age of Onset   Breast cancer Mother 56   Melanoma Father        dx mid 18s   Aneurysm Paternal Grandmother    Heart attack Paternal  Grandfather    Lung cancer Paternal Uncle     Social History   Socioeconomic History   Marital status: Married    Spouse name: Not on file   Number of children: Not on file   Years of education: Not on file   Highest education level: Not on file  Occupational History   Not on file  Tobacco Use   Smoking status: Never   Smokeless tobacco: Never  Vaping Use   Vaping status: Never Used  Substance and Sexual Activity   Alcohol use: Yes    Alcohol/week: 6.0 standard drinks of alcohol    Types: 6 Standard drinks or equivalent per week   Drug use: No   Sexual activity: Yes    Birth control/protection: None, I.U.D.    Comment: VASectomy//Mirena 09/2014  Other Topics Concern   Not on file  Social History Narrative   Not on file   Social Determinants of Health   Financial Resource Strain: Not on file  Food Insecurity: Not on file  Transportation Needs: Not on file  Physical Activity: Not on file  Stress: Not on file  Social Connections: Unknown (07/05/2021)   Received from [facility]   Social Network    Social Network: Not on file  Intimate Partner Violence: Unknown (05/27/2021)   [facility]   HITS    Physically Hurt: Not on file    Insult or Talk Down To: Not on file    Threaten Physical Harm: Not on file    Scream or Curse: Not on file    Review of Systems  All other systems reviewed and are negative.   PHYSICAL EXAMINATION:   BP 132/78 (BP Location: Left Arm, Patient Position: Sitting, Cuff Size: Normal)   Pulse 86   Wt 201 lb (91.2 kg)   SpO2 99%   BMI 29.90 kg/m     General appearance: alert, cooperative and appears stated age Head: Normocephalic, without obvious abnormality, atraumatic Neck: no adenopathy, supple, symmetrical, trachea midline and thyroid normal to inspection and palpation Lungs: clear to auscultation bilaterally Breasts: normal appearance, no masses or tenderness, No nipple retraction or dimpling, No nipple discharge or bleeding, No axillary or supraclavicular adenopathy Heart: regular rate and rhythm Abdomen: soft, non-tender, no masses,  no organomegaly Extremities: extremities normal, atraumatic, no cyanosis or edema Skin: Skin color, texture, turgor normal. No rashes or lesions Lymph nodes: Cervical, supraclavicular, and axillary nodes normal. No abnormal inguinal nodes palpated Neurologic: Grossly normal  Pelvic: External genitalia:  no lesions              Urethra:  normal appearing urethra with no masses, tenderness or lesions              Bartholins and Skenes: normal                 Vagina: normal appearing vagina with normal color and discharge, no lesions              Cervix: no lesions                Bimanual Exam:  Uterus:  normal size, contour, position, consistency, mobility, non-tender              Adnexa: no mass, fullness, tenderness              Rectal exam: {yes no:314532}.  Confirms.              Anus:  normal sphincter tone, no lesions  Chaperone was  present for exam:  Warren Lacy, CMA  Mirena IUD exchange - Lot [id number] Consent done.  Hibiclens prep.  IUD removed with ring forceps, intact, shown to patient, and discarded. Teanculum to anterior cervical lip.  IUD placed to 7.5 cm after uterus sounded. Strings trimmed.  Declined to be seen by patient.  Minimal EBL.  No complications.  Repeat BM exam, no change.   ASSESSMENT:  Mirena IUD exchange.  PLAN:  New IUD card to patient.  Instructions and precautions given.  Fu 7 weeks.        An After Visit Summary was provided to the patient.

## 2023-01-10 ENCOUNTER — Encounter: Payer: Self-pay | Admitting: Obstetrics and Gynecology

## 2023-01-10 ENCOUNTER — Other Ambulatory Visit: Payer: BC Managed Care – PPO

## 2023-01-10 ENCOUNTER — Ambulatory Visit (INDEPENDENT_AMBULATORY_CARE_PROVIDER_SITE_OTHER): Payer: BC Managed Care – PPO | Admitting: Obstetrics and Gynecology

## 2023-01-10 VITALS — BP 132/78 | HR 86 | Ht 68.75 in | Wt 201.0 lb

## 2023-01-10 DIAGNOSIS — Z114 Encounter for screening for human immunodeficiency virus [HIV]: Secondary | ICD-10-CM

## 2023-01-10 DIAGNOSIS — Z30433 Encounter for removal and reinsertion of intrauterine contraceptive device: Secondary | ICD-10-CM

## 2023-01-10 DIAGNOSIS — Z113 Encounter for screening for infections with a predominantly sexual mode of transmission: Secondary | ICD-10-CM

## 2023-01-10 DIAGNOSIS — Z01812 Encounter for preprocedural laboratory examination: Secondary | ICD-10-CM

## 2023-01-10 DIAGNOSIS — Z0000 Encounter for general adult medical examination without abnormal findings: Secondary | ICD-10-CM

## 2023-01-10 DIAGNOSIS — Z1159 Encounter for screening for other viral diseases: Secondary | ICD-10-CM

## 2023-01-10 LAB — PREGNANCY, URINE: Preg Test, Ur: NEGATIVE

## 2023-01-11 NOTE — Patient Instructions (Signed)
Intrauterine Device Insertion An intrauterine device (IUD) is a medical device that is inserted into the uterus to prevent pregnancy. It is a small, T-shaped device that has one or two nylon strings hanging down from it. The strings hang out of the lower part of the uterus (cervix) to allow for future IUD removal. There are two types of IUDs: Hormone IUD. This type of IUD is made of plastic and contains the hormone progestin (synthetic progesterone). A hormone IUD may last 3-5 years, depending on which one you have. Synthetic progesterone prevents pregnancy by: Thickening cervical mucus to prevent sperm from entering the uterus. Thinning the uterine lining to prevent a fertilized egg from implanting there. Copper IUD. This type of IUD has copper wire wrapped around it. A copper IUD may last up to 10 years. Copper prevents pregnancy by making the uterus and fallopian tubes produce a fluid that kills sperm. Tell a health care provider about: Any allergies you have. All medicines you are taking, including vitamins, herbs, eye drops, creams, and over-the-counter medicines. Any surgeries you have had. Any medical conditions you have, including any sexually transmitted infections (STIs) you may have. Whether you are pregnant or may be pregnant. What are the risks? Generally, this is a safe procedure. However, problems may occur, including: Infection. Bleeding. Allergic reactions to medicines. Puncture (perforation) of the uterus or damage to other structures or organs. Accidental placement of the IUD either in the muscle layer of the uterus (myometrium) or outside the uterus. The IUD falling out of the uterus (expulsion). This is more common among women who have recently had a child. Higher risk of an egg being fertilized outside your uterus (ectopic pregnancy).This is rare. Pelvic inflammatory disease (PID), which is an infection in the uterus and fallopian tubes. The IUD does not cause the  infection. The infection is usually from an unknown sexually transmitted infection (STI). This is rare, and it usually happens during the first 20 days after the IUD is inserted. What happens before the procedure? Ask your health care provider about: Changing or stopping your regular medicines. This is especially important if you are taking diabetes medicines or blood thinners. Taking over-the-counter medicines, vitamins, herbs, and supplements. Talk with your health care provider about when to schedule your IUD placement. Your health care provider may recommend taking over-the-counter pain medicines before the procedure. These medicines include ibuprofen and naproxen. You may have tests for: Pregnancy. A pregnancy test involves having a urine or blood sample taken. Sexually transmitted infections (STIs). Placing an IUD in someone who has an STI can make the infection worse. Cervical cancer. You may have a Pap test to check for this type of cancer. This means collecting cells from your cervix to be checked under a microscope. You may have a physical exam to determine the size and position of your uterus. What happens during the procedure? A tool (speculum) will be placed in your vagina and widened so that your health care provider can see your cervix. Medicine, or antiseptic, may be applied to your cervix to help lower your risk of infection. You may be given an anesthetic medicine to numb each side of your cervix. This medicine is usually given by an injection into the cervix. A tool called a uterine sound will be inserted into your uterus to check the length of your uterus and the direction that your uterus may be tilted. A slim instrument or tube (IUD inserter) that holds the IUD will be inserted into your vagina,  through your cervical canal, and into your uterus. The IUD will be placed in the uterus, and the IUD inserter will be removed. The strings that are attached to the IUD will be trimmed  so that they lie just below the cervix. The speculum will be removed. The procedure may vary among health care providers and hospitals. What can I expect after procedure? You may have bleeding after the procedure. This is normal. It varies from light bleeding (spotting) for a few days to menstrual-like bleeding. You may have cramping and pain in the abdomen. You may feel dizzy or light-headed. You may have lower back pain. You may have headaches and nausea. Follow these instructions at home: Before resuming sexual activity, check to make sure that you can feel the IUD string or strings. You should be able to feel the end of the string below the opening of your cervix. If your IUD string is in place, you may resume sexual activity. If you had a hormonal IUD inserted more than 7 days after your most recent period started, you will need to use a backup method of birth control for 7 days after IUD insertion. Ask your health care provider whether this applies to you. Continue to check that the IUD is still in place by feeling for the strings after every menstrual period, or once a month. An IUD will not protect you from sexually transmitted infections (STIs). Use methods to prevent the exchange of body fluids between partners (barrier protection) every time you have sex. Barrier protection can be used during oral, vaginal, or anal sex. Commonly used barrier methods include: Female condom. Female condom. Dental dam. Take over-the-counter and prescription medicines only as told by your health care provider. Keep all follow-up visits. This is important. Contact a health care provider if: You feel light-headed or weak. You have any of the following problems with your IUD string or strings: The string bothers or hurts you or your sexual partner. You cannot feel the string. The string has gotten longer. You can feel the IUD in your vagina. You think you may be pregnant, or you miss your menstrual  period. You think you may have a sexually transmitted infection (STI). Get help right away if you: You have flu-like symptoms, such as tiredness (fatigue) and muscle aches. You have a fever and chills. You have bleeding that is heavier or lasts longer than a normal menstrual cycle. You have abnormal or bad-smelling discharge from your vagina. You develop abdominal pain that is new, is getting worse, or is not in the same area of earlier cramping and pain. You have pain during sexual activity. Summary An intrauterine device (IUD) is a small, T-shaped device that has one or two nylon strings hanging down from it. You may have a copper IUD or a hormone IUD. Ask your health care provider what you need to do before the procedure. You may have some tests and you may have to change or stop some medicines. You may have bleeding after the procedure. This is normal. It varies from light spotting for a few days to menstrual-like bleeding. Check to make sure that you can feel the IUD strings before you resume sexual activity. Check the strings after every menstrual period or once a month. An IUD does not protect against STIs. Use other methods to protect yourself against infections. This information is not intended to replace advice given to you by your health care provider. Make sure you discuss any questions you have with  your health care provider. Document Revised: 08/21/2019 Document Reviewed: 08/21/2019 Elsevier Patient Education  2024 [employer].

## 2023-01-12 LAB — COMPREHENSIVE METABOLIC PANEL
AG Ratio: 1.6 (calc) (ref 1.0–2.5)
ALT: 17 U/L (ref 6–29)
AST: 14 U/L (ref 10–35)
Albumin: 4.4 g/dL (ref 3.6–5.1)
Alkaline phosphatase (APISO): 93 U/L (ref 31–125)
BUN: 10 mg/dL (ref 7–25)
CO2: 22 mmol/L (ref 20–32)
Calcium: 9.2 mg/dL (ref 8.6–10.2)
Chloride: 102 mmol/L (ref 98–110)
Creat: 0.84 mg/dL (ref 0.50–0.99)
Globulin: 2.7 g/dL (ref 1.9–3.7)
Glucose, Bld: 109 mg/dL — ABNORMAL HIGH (ref 65–99)
Potassium: 4.3 mmol/L (ref 3.5–5.3)
Sodium: 135 mmol/L (ref 135–146)
Total Bilirubin: 0.8 mg/dL (ref 0.2–1.2)
Total Protein: 7.1 g/dL (ref 6.1–8.1)

## 2023-01-12 LAB — CBC
HCT: 44.8 % (ref 35.0–45.0)
Hemoglobin: 15.6 g/dL — ABNORMAL HIGH (ref 11.7–15.5)
MCH: 33.8 pg — ABNORMAL HIGH (ref 27.0–33.0)
MCHC: 34.8 g/dL (ref 32.0–36.0)
MCV: 97.2 fL (ref 80.0–100.0)
MPV: 9.1 fL (ref 7.5–12.5)
Platelets: 291 10*3/uL (ref 140–400)
RBC: 4.61 10*6/uL (ref 3.80–5.10)
RDW: 11.7 % (ref 11.0–15.0)
WBC: 5.9 10*3/uL (ref 3.8–10.8)

## 2023-01-12 LAB — LIPID PANEL
Cholesterol: 230 mg/dL — ABNORMAL HIGH (ref <200)
HDL: 83 mg/dL (ref >=50)
LDL Cholesterol (Calc): 105 mg/dL — ABNORMAL HIGH
Non-HDL Cholesterol (Calc): 147 mg/dL — ABNORMAL HIGH (ref <130)
Total CHOL/HDL Ratio: 2.8 (calc) (ref <5.0)
Triglycerides: 292 mg/dL — ABNORMAL HIGH (ref <150)

## 2023-01-12 LAB — HIV ANTIBODY (ROUTINE TESTING W REFLEX): HIV 1&2 Ab, 4th Generation: NONREACTIVE

## 2023-01-12 LAB — RPR: RPR Ser Ql: NONREACTIVE

## 2023-01-12 LAB — HEPATITIS C ANTIBODY: Hepatitis C Ab: NONREACTIVE

## 2023-01-13 ENCOUNTER — Encounter: Payer: Self-pay | Admitting: Obstetrics and Gynecology

## 2023-01-22 ENCOUNTER — Other Ambulatory Visit: Payer: Self-pay | Admitting: Obstetrics and Gynecology

## 2023-01-22 DIAGNOSIS — R7309 Other abnormal glucose: Secondary | ICD-10-CM

## 2023-01-22 NOTE — Progress Notes (Signed)
Future order for A1C.

## 2023-03-13 ENCOUNTER — Encounter: Payer: Self-pay | Admitting: Obstetrics and Gynecology

## 2023-03-22 ENCOUNTER — Telehealth: Payer: Self-pay | Admitting: Obstetrics and Gynecology

## 2023-03-22 NOTE — Telephone Encounter (Signed)
Dr Edward Jolly wanted patient to returned for an IUD follow up. I spoke with the patient in November she stated she would call back after the holiday to schedule the appointment. Mailed letter on January 20 reminding patient to call and schedule; patient has not called back.

## 2023-03-27 NOTE — Telephone Encounter (Signed)
Spoke with patient. Patient states she does not have her work calendar with her, will return call to schedule IUD recheck at a later time.

## 2023-04-05 NOTE — Telephone Encounter (Signed)
Per review of EPIC, patient has not returned call or scheduled IUD recheck.   Routing to Dr. Edward Jolly

## 2023-04-05 NOTE — Telephone Encounter (Signed)
Encounter reviewed.   Ok to close encounter and wait for patient to call and schedule.
